# Patient Record
Sex: Female | Born: 1985 | Race: White | Hispanic: No | Marital: Single | Smoking: Former smoker
Health system: Southern US, Community
[De-identification: ages and names within clinical notes are randomized; demographics above are authoritative.]

## PROBLEM LIST (undated history)

## (undated) ENCOUNTER — Inpatient Hospital Stay (HOSPITAL_COMMUNITY): Payer: Self-pay

## (undated) DIAGNOSIS — Z8711 Personal history of peptic ulcer disease: Secondary | ICD-10-CM

## (undated) DIAGNOSIS — J45909 Unspecified asthma, uncomplicated: Secondary | ICD-10-CM

## (undated) DIAGNOSIS — F419 Anxiety disorder, unspecified: Secondary | ICD-10-CM

## (undated) DIAGNOSIS — R51 Headache: Secondary | ICD-10-CM

## (undated) DIAGNOSIS — N201 Calculus of ureter: Secondary | ICD-10-CM

## (undated) DIAGNOSIS — Z87898 Personal history of other specified conditions: Secondary | ICD-10-CM

## (undated) DIAGNOSIS — K219 Gastro-esophageal reflux disease without esophagitis: Secondary | ICD-10-CM

## (undated) DIAGNOSIS — F429 Obsessive-compulsive disorder, unspecified: Secondary | ICD-10-CM

## (undated) DIAGNOSIS — N289 Disorder of kidney and ureter, unspecified: Secondary | ICD-10-CM

## (undated) DIAGNOSIS — A048 Other specified bacterial intestinal infections: Secondary | ICD-10-CM

## (undated) DIAGNOSIS — I499 Cardiac arrhythmia, unspecified: Secondary | ICD-10-CM

## (undated) HISTORY — DX: Other specified bacterial intestinal infections: A04.8

## (undated) HISTORY — PX: CERVIX LESION DESTRUCTION: SHX591

## (undated) HISTORY — DX: Personal history of peptic ulcer disease: Z87.11

## (undated) HISTORY — PX: TONSILLECTOMY AND ADENOIDECTOMY: SUR1326

## (undated) HISTORY — PX: TYMPANOSTOMY TUBE PLACEMENT: SHX32

## (undated) HISTORY — PX: EXTRACORPOREAL SHOCK WAVE LITHOTRIPSY: SHX1557

## (undated) HISTORY — PX: WISDOM TOOTH EXTRACTION: SHX21

---
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
## 2007-03-21 ENCOUNTER — Ambulatory Visit: Payer: Self-pay | Admitting: Oncology

## 2007-05-08 ENCOUNTER — Ambulatory Visit: Payer: Self-pay | Admitting: Oncology

## 2007-07-23 ENCOUNTER — Ambulatory Visit: Payer: Self-pay | Admitting: Oncology

## 2007-07-24 LAB — CBC WITH DIFFERENTIAL/PLATELET
BASO%: 0.3 % (ref 0.0–2.0)
Basophils Absolute: 0 10*3/uL (ref 0.0–0.1)
EOS%: 7 % (ref 0.0–7.0)
Eosinophils Absolute: 0.7 10*3/uL — ABNORMAL HIGH (ref 0.0–0.5)
HCT: 41.8 % (ref 34.8–46.6)
HGB: 14.7 g/dL (ref 11.6–15.9)
LYMPH%: 24.6 % (ref 14.0–48.0)
MCH: 29.9 pg (ref 26.0–34.0)
MCHC: 35.2 g/dL (ref 32.0–36.0)
MCV: 85 fL (ref 81.0–101.0)
MONO#: 0.8 10*3/uL (ref 0.1–0.9)
MONO%: 7.9 % (ref 0.0–13.0)
NEUT#: 5.8 10*3/uL (ref 1.5–6.5)
NEUT%: 60.2 % (ref 39.6–76.8)
Platelets: 288 10*3/uL (ref 145–400)
RBC: 4.92 10*6/uL (ref 3.70–5.32)
RDW: 13.1 % (ref 11.3–14.5)
WBC: 9.6 10*3/uL (ref 3.9–10.0)
lymph#: 2.4 10*3/uL (ref 0.9–3.3)

## 2007-07-24 LAB — APTT: aPTT: 28 seconds (ref 24–37)

## 2007-07-24 LAB — PROTHROMBIN TIME
INR: 1 (ref 0.0–1.5)
Prothrombin Time: 13.5 seconds (ref 11.6–15.2)

## 2007-07-24 LAB — MORPHOLOGY
PLT EST: ADEQUATE
RBC Comments: NORMAL

## 2007-07-26 LAB — FACTOR 5 LEIDEN

## 2007-07-26 LAB — PROTHROMBIN GENE MUTATION

## 2009-02-10 IMAGING — CT CT UROGRAM
2 of 3 series · 15 of 42 positions shown, 19 images · non-contrast
Comparison: NONE

CLINICAL DATA: Left flank pain.  Hx. renal calculi. 

CT UROGRAM
TECHNIQUE: Thin-section unenhanced axial images were obtained to 
provide a CT urogram to evaluate for possible urinary tract stone. 
 Scan thicknesses were 3.0 mm with 3.0-mm increments.

[Series 2: wo · axial · 0.66mm/px · z∈[+490,+847]mm · 12 of 135 slices shown, 16 images]
[im 8/135  soft-tissue]
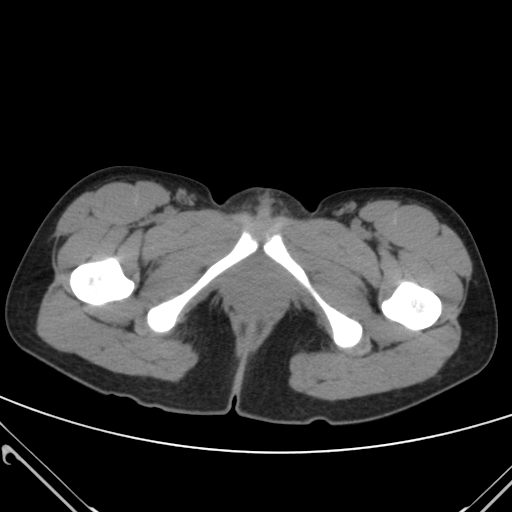
[im 8/135  bone]
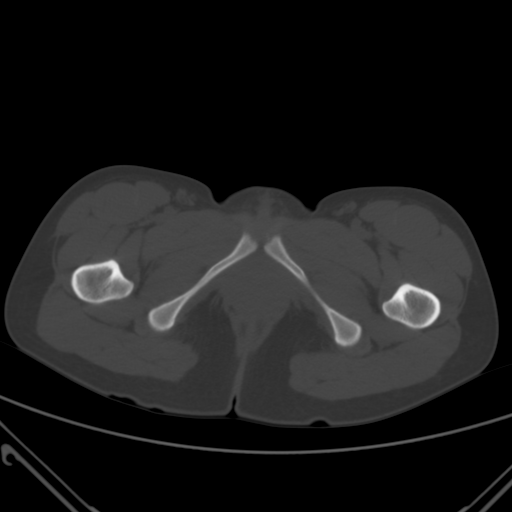
[im 22/135  soft-tissue]
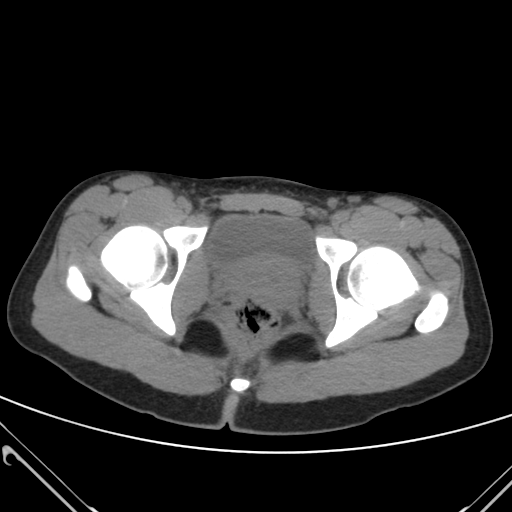
[im 36/135  soft-tissue]
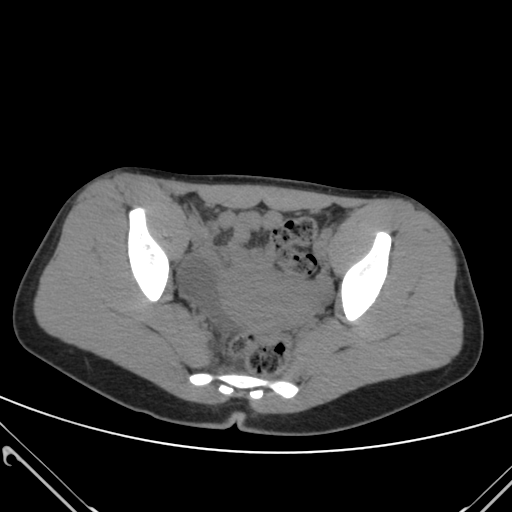
[im 50/135  soft-tissue]
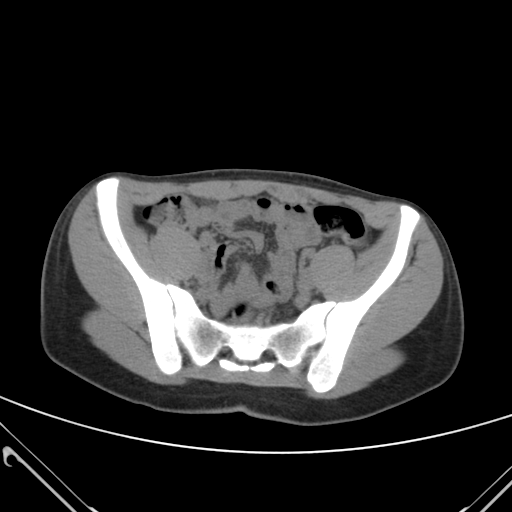
[im 64/135  soft-tissue]
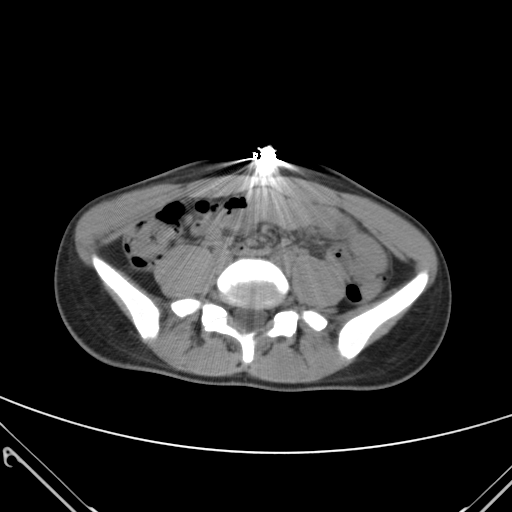
[im 71/135  soft-tissue]
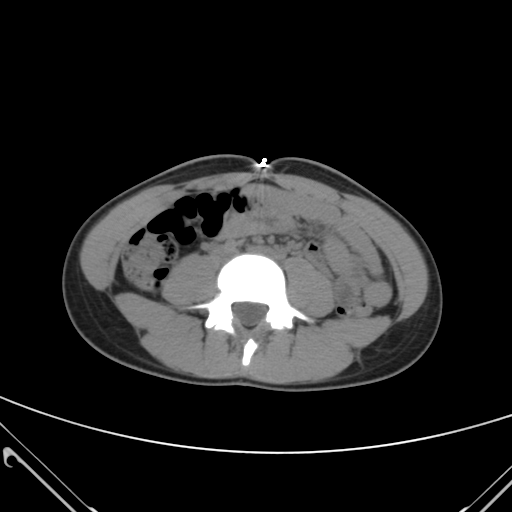
[im 85/135  soft-tissue]
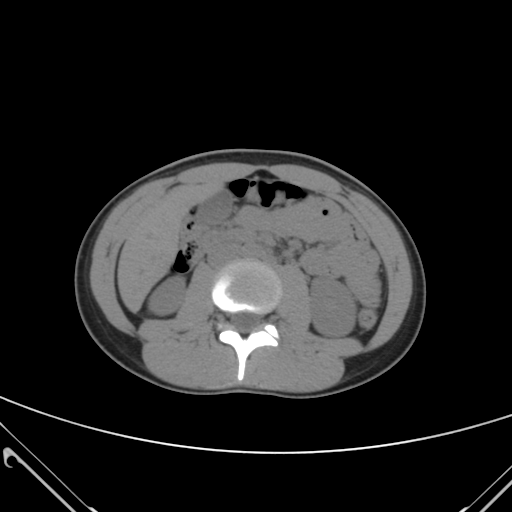
[im 99/135  soft-tissue]
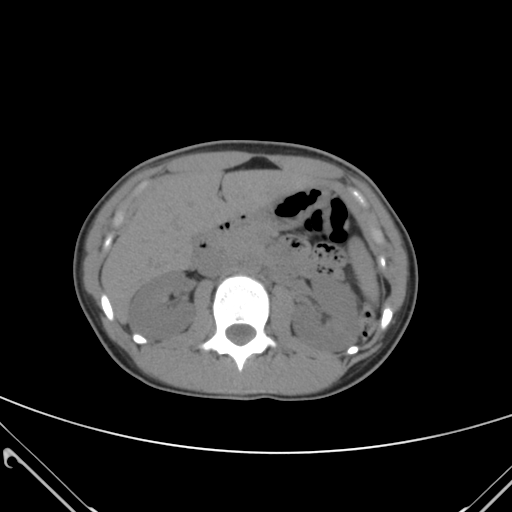
[im 106/135  lung]
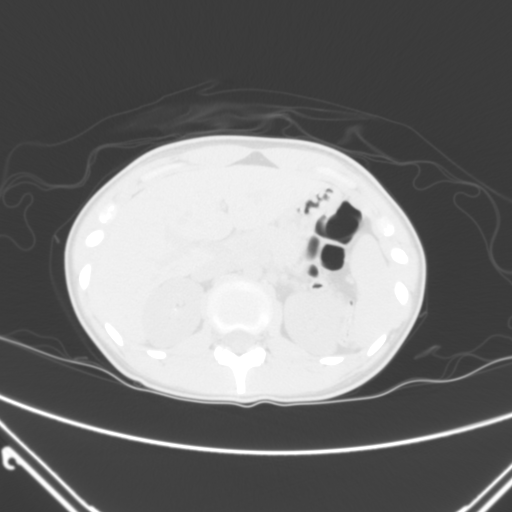
[im 113/135  soft-tissue]
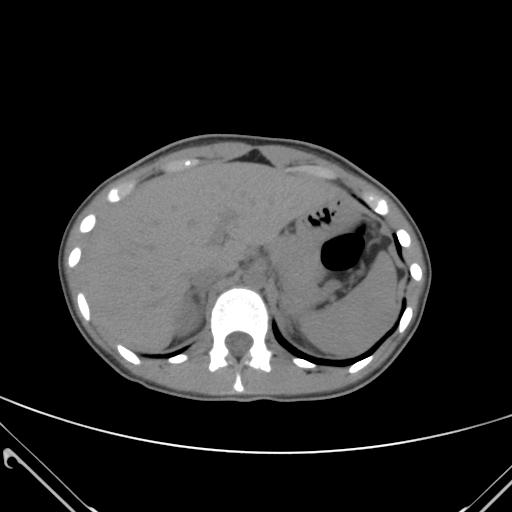
[im 113/135  lung]
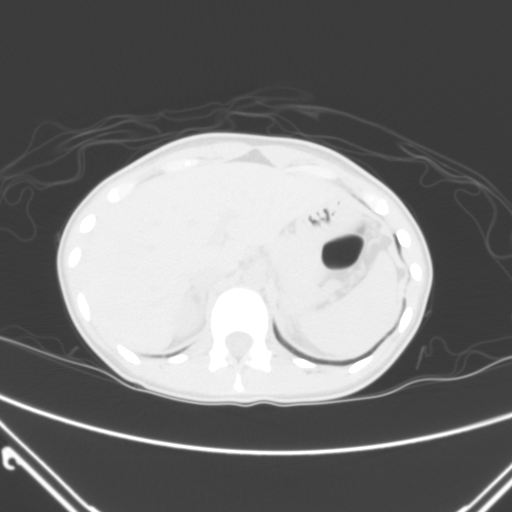
[im 113/135  bone]
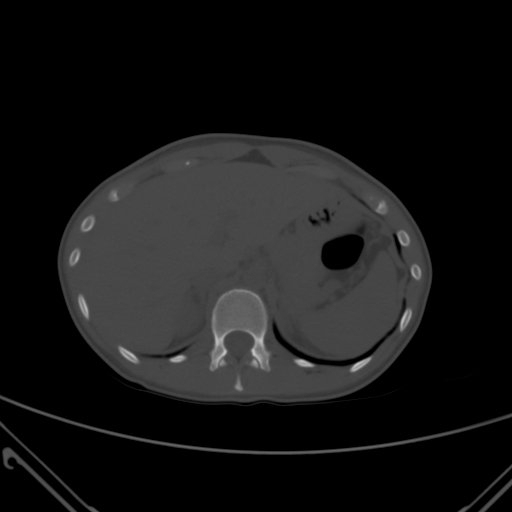
[im 120/135  lung]
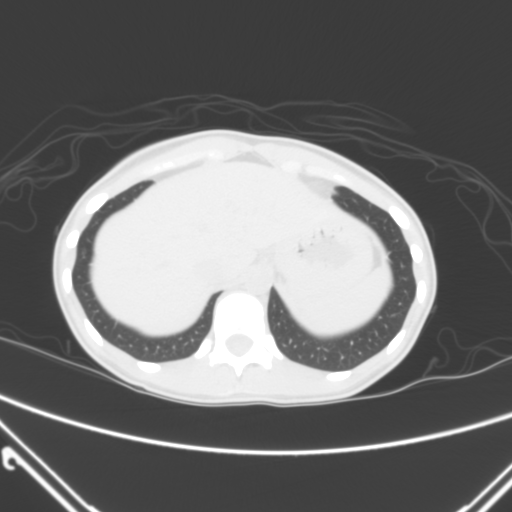
[im 127/135  soft-tissue]
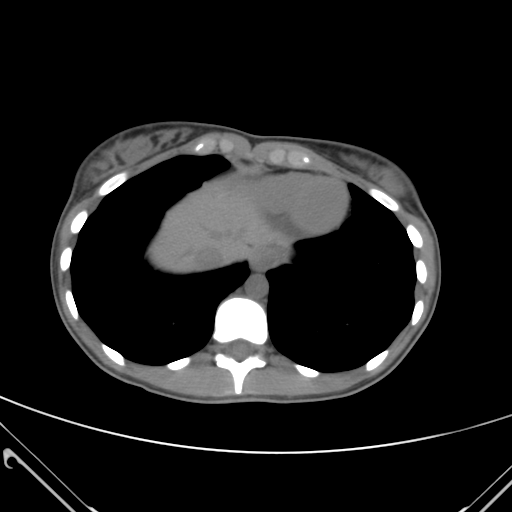
[im 127/135  lung]
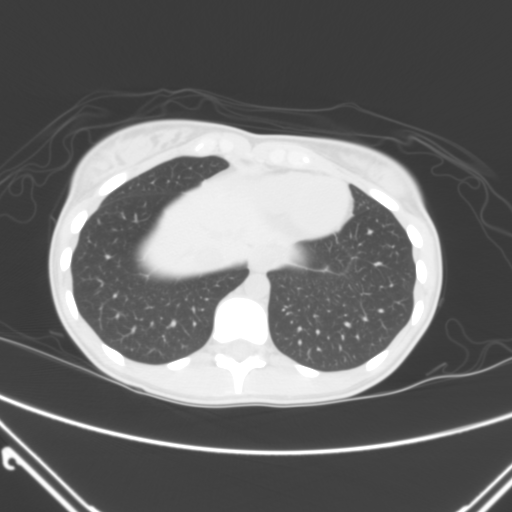

[coronals · coronal · 0.78mm/px · 3 of 51 slices shown]
[im 17/51  soft-tissue]
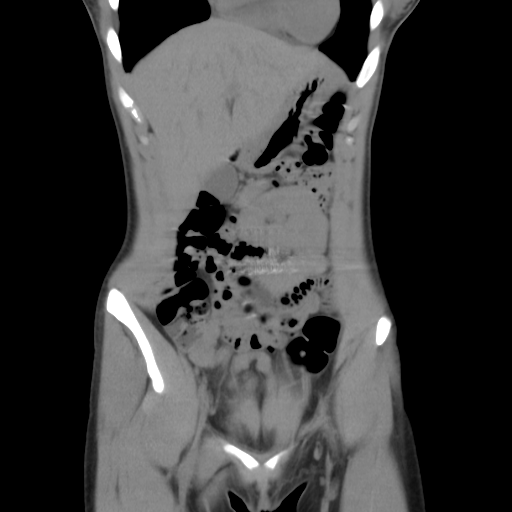
[im 23/51  soft-tissue]
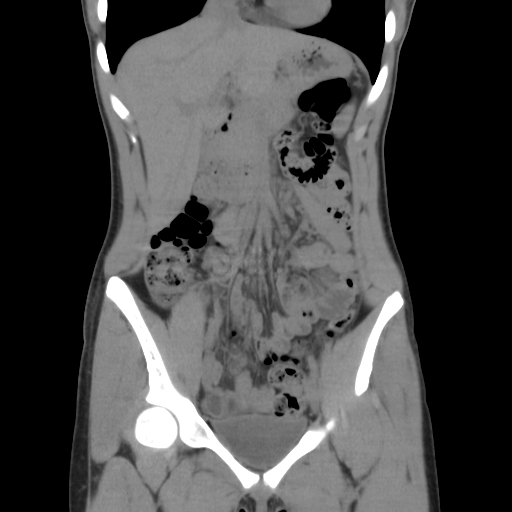
[im 28/51  soft-tissue]
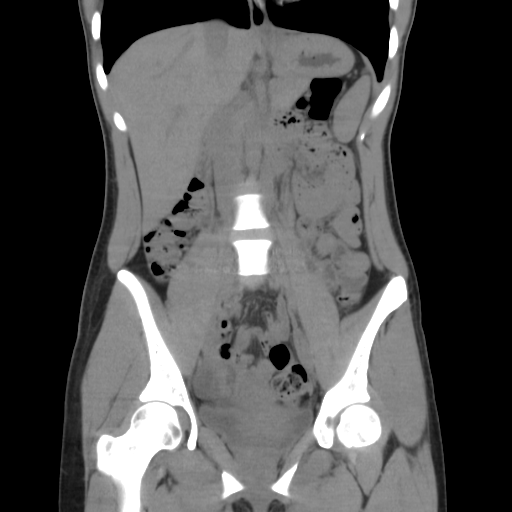

[15 of 42 positions shown; findings below may reference images not displayed]

FINDINGS: Comparison to previous CT Abdomen and Pelvis dated 
07-02-2006. The lung bases are clear.  The liver, spleen, 
pancreas, and adrenal glands have a normal appearance.  There are 
small 2-3 mm calculi within the kidneys bilaterally, which are 
nonobstructing.  No ureteral calculi are identified.  No evidence 
for obstruction is seen.  Incidentally noted is a fluid-filled 
structure in the right adnexal region, likely representing an 
ovarian cyst measuring 8.5x9.9x2.5 cm.  No free fluid is seen.  
The appendix is felt to be normal.
IMPRESSION: Bilateral nonobstructing renal calculi.  No evidence 
for ureterolithiasis is identified.  Right adnexal cystic mass. 
Date: 07/05/2007  Tran Date: 07/05/2007 DAS  JLM

## 2009-06-06 ENCOUNTER — Inpatient Hospital Stay (HOSPITAL_COMMUNITY): Admission: AD | Admit: 2009-06-06 | Discharge: 2009-06-06 | Payer: Self-pay | Admitting: Obstetrics and Gynecology

## 2009-06-09 ENCOUNTER — Emergency Department (HOSPITAL_COMMUNITY): Admission: EM | Admit: 2009-06-09 | Discharge: 2009-06-09 | Payer: Self-pay | Admitting: Dentistry

## 2010-09-02 ENCOUNTER — Encounter: Payer: Self-pay | Admitting: Cardiology

## 2010-09-13 ENCOUNTER — Encounter: Payer: Self-pay | Admitting: Cardiology

## 2010-09-27 ENCOUNTER — Ambulatory Visit: Payer: Self-pay

## 2010-09-27 ENCOUNTER — Ambulatory Visit: Payer: Self-pay | Admitting: Cardiology

## 2010-09-27 ENCOUNTER — Encounter: Payer: Self-pay | Admitting: Cardiology

## 2010-09-27 DIAGNOSIS — R55 Syncope and collapse: Secondary | ICD-10-CM | POA: Insufficient documentation

## 2010-09-27 DIAGNOSIS — N83209 Unspecified ovarian cyst, unspecified side: Secondary | ICD-10-CM | POA: Insufficient documentation

## 2010-09-27 DIAGNOSIS — R002 Palpitations: Secondary | ICD-10-CM | POA: Insufficient documentation

## 2010-09-27 DIAGNOSIS — N200 Calculus of kidney: Secondary | ICD-10-CM | POA: Insufficient documentation

## 2010-09-27 DIAGNOSIS — R42 Dizziness and giddiness: Secondary | ICD-10-CM | POA: Insufficient documentation

## 2010-09-27 DIAGNOSIS — J45909 Unspecified asthma, uncomplicated: Secondary | ICD-10-CM | POA: Insufficient documentation

## 2010-10-11 ENCOUNTER — Encounter: Payer: Self-pay | Admitting: Cardiology

## 2010-10-11 ENCOUNTER — Ambulatory Visit (HOSPITAL_COMMUNITY)
Admission: RE | Admit: 2010-10-11 | Discharge: 2010-10-11 | Payer: Self-pay | Source: Home / Self Care | Admitting: Cardiology

## 2010-10-11 ENCOUNTER — Ambulatory Visit: Payer: Self-pay

## 2010-10-11 ENCOUNTER — Ambulatory Visit: Payer: Self-pay | Admitting: Cardiology

## 2010-10-11 HISTORY — PX: TRANSTHORACIC ECHOCARDIOGRAM: SHX275

## 2010-11-10 ENCOUNTER — Telehealth (INDEPENDENT_AMBULATORY_CARE_PROVIDER_SITE_OTHER): Payer: Self-pay | Admitting: *Deleted

## 2010-11-29 ENCOUNTER — Ambulatory Visit
Admission: RE | Admit: 2010-11-29 | Discharge: 2010-11-29 | Payer: Self-pay | Source: Home / Self Care | Attending: Cardiology | Admitting: Cardiology

## 2010-12-14 NOTE — Assessment & Plan Note (Signed)
Summary: np6/dx:irregular heart rate ;syncope/lg   History of Present Illness: 25 yo female for evaluation of syncope. Over the past 2-3 months the patient describes episodes of near syncope. She will suddenly feel lightheaded followed by her heart racing, and a hot feeling, mild nausea and diaphoresis. These episodes can last 30 minutes to one hour. They resolved spontaneously. She has not had frank syncope. She also notes occasional dyspnea on exertion but no orthopnea, PND, pedal edema or exertional chest pain. Because of the above we were asked to further evaluate.  Current Medications (verified): 1)  Birth Control .... As Directed  Past History:  Past Medical History: OVARIAN CYST  NEPHROLITHIASIS  ASTHMA  PUD  Past Surgical History: lithotripsy tonsillectomy  Wisdom teeth  Family History: Reviewed history from 09/27/2010 and no changes required. Respiratory/asthma Hiatal Hernia/ Peptic ulcer No premature CAD No sudden death  Social History: Reviewed history from 09/27/2010 and no changes required. Tobacco Use - Yes Single  Full Time Alcohol Use - yes  Review of Systems       no fevers or chills, productive cough, hemoptysis, dysphasia, odynophagia, melena, hematochezia, dysuria, hematuria, rash, seizure activity, orthopnea, PND, pedal edema, claudication. Remaining systems are negative.   Vital Signs:  Patient profile:   25 year old female Height:      62 inches Weight:      108 pounds BMI:     19.82 Pulse rate:   68 / minute Resp:     14 per minute BP sitting:   109 / 68  (left arm)  Vitals Entered By: Kem Parkinson (September 27, 2010 10:12 AM)  Physical Exam  General:  Well developed/well nourished in NAD Skin warm/dry Patient not depressed No peripheral clubbing Back-normal HEENT-normal/normal eyelids Neck supple/normal carotid upstroke bilaterally; no bruits; no JVD; no thyromegaly chest - CTA/ normal expansion CV - RRR/normal S1 and S2; no  murmurs, rubs or gallops;  PMI nondisplaced Abdomen -NT/ND, no HSM, no mass, + bowel sounds, no bruit 2+ femoral pulses, no bruits Ext-no edema, chords, 2+ DP Neuro-grossly nonfocal     EKG  Procedure date:  09/27/2010  Findings:      NSR with no ST changes; normal QT interval  Impression & Recommendations:  Problem # 1:  SYNCOPE (ICD-780.2) Patient describes near syncopal episodes. Some of her description sounds possibly vagal in etiology. I have asked her to stay well hydrated. I will schedule a CardioNet monitor and an echocardiogram to more fully evaluate.  Problem # 2:  PALPITATIONS (ICD-785.1) Schedule CardioNet.  Other Orders: Echocardiogram (Echo) Event (Event)  Patient Instructions: 1)  Your physician recommends that you schedule a follow-up appointment in: 8 WEEKS 2)  Your physician has requested that you have an echocardiogram.  Echocardiography is a painless test that uses sound waves to create images of your heart. It provides your doctor with information about the size and shape of your heart and how well your heart's chambers and valves are working.  This procedure takes approximately one hour. There are no restrictions for this procedure. 3)  Your physician has recommended that you wear an event monitor.  Event monitors are medical devices that record the heart's electrical activity. Doctors most often use these monitors to diagnose arrhythmias. Arrhythmias are problems with the speed or rhythm of the heartbeat. The monitor is a small, portable device. You can wear one while you do your normal daily activities. This is usually used to diagnose what is causing palpitations/syncope (passing out).

## 2010-12-14 NOTE — Letter (Signed)
Summary: [facility] Office Note   [facility] Office Note   Imported By: Roderic Ovens 10/21/2010 13:12:17  _____________________________________________________________________  External Attachment:    Type:   Image     Comment:   External Document

## 2010-12-15 NOTE — Progress Notes (Signed)
  Phone Note Outgoing Call   Call placed by: Scherrie Bateman, LPN,  November 10, 2010 3:47 PM Call placed to: Patient'S MOTHER Summary of Call: PT'S MOTHER AWARE PER DR Jens Som  MONITOR SHOWS SINUS./ Initial call taken by: Scherrie Bateman, LPN,  November 10, 2010 3:48 PM

## 2010-12-15 NOTE — Assessment & Plan Note (Signed)
Summary: F2M PER CHECK OUT ON 11/15/LG   CC:  follow up..pt complains of sob..pt states it maybe related to asthma.  History of Present Illness: 25 year old female I saw in November of 2011 for palpitations and syncope. An echocardiogram in November of 2011 showed normal LV function. A CardioNet showed sinus rhythm to sinus tachycardia. Since I last saw her, the patient has dyspnea with more extreme activities but not with routine activities. She attributes to her asthma. It is relieved with rest. It is not associated with chest pain. There is no orthopnea, PND or pedal edema. There is no syncope. There is no exertional chest pain. She has had brief palpitations when she feels "stressed". There's been no further presyncopal episodes.   Current Medications (verified): 1)  Birth Control .... As Directed 2)  Advil 200 Mg Tabs (Ibuprofen) .... As Needed  Past History:  Past Medical History: Reviewed history from 09/27/2010 and no changes required. OVARIAN CYST  NEPHROLITHIASIS  ASTHMA  PUD  Past Surgical History: Reviewed history from 09/27/2010 and no changes required. lithotripsy tonsillectomy  Wisdom teeth  Social History: Reviewed history from 09/27/2010 and no changes required. Tobacco Use - Yes Single  Full Time Alcohol Use - yes  Review of Systems       no fevers or chills, productive cough, hemoptysis, dysphasia, odynophagia, melena, hematochezia, dysuria, hematuria, rash, seizure activity, orthopnea, PND, pedal edema, claudication. Remaining systems are negative.   Vital Signs:  Patient profile:   25 year old female Height:      62 inches Weight:      110 pounds BMI:     20.19 Pulse rate:   70 / minute Resp:     14 per minute BP sitting:   100 / 64  (left arm)  Vitals Entered By: Kem Parkinson (November 29, 2010 8:59 AM)  Physical Exam  General:  Well-developed well-nourished in no acute distress.  Skin is warm and dry.  HEENT is normal.  Neck is  supple. No thyromegaly.  Chest is clear to auscultation with normal expansion.  Cardiovascular exam is regular rate and rhythm.  Abdominal exam nontender or distended. No masses palpated. Extremities show no edema. neuro grossly intact    Impression & Recommendations:  Problem # 1:  PALPITATIONS (ICD-785.1) No pathology demonstrated at this point. If she has increased symptoms in the future we can consider a beta blocker at that point.  Problem # 2:  SYNCOPE (ICD-780.2) No recurrent presyncopal episodes. LV function normal.

## 2011-02-19 LAB — URINALYSIS, ROUTINE W REFLEX MICROSCOPIC
Bilirubin Urine: NEGATIVE
Bilirubin Urine: NEGATIVE
Glucose, UA: NEGATIVE mg/dL
Glucose, UA: NEGATIVE mg/dL
Ketones, ur: NEGATIVE mg/dL
Ketones, ur: >80 mg/dL — AB
Leukocytes, UA: NEGATIVE
Leukocytes, UA: NEGATIVE
Nitrite: NEGATIVE
Nitrite: NEGATIVE
Protein, ur: NEGATIVE mg/dL
Protein, ur: NEGATIVE mg/dL
Specific Gravity, Urine: 1.009 (ref 1.005–1.030)
Specific Gravity, Urine: >1.03 — ABNORMAL HIGH (ref 1.005–1.030)
Urobilinogen, UA: 0.2 mg/dL (ref 0.0–1.0)
Urobilinogen, UA: 0.2 mg/dL (ref 0.0–1.0)
pH: 7.5 (ref 5.0–8.0)
pH: 6 (ref 5.0–8.0)

## 2011-02-19 LAB — POCT PREGNANCY, URINE
Preg Test, Ur: NEGATIVE
Preg Test, Ur: NEGATIVE

## 2011-02-19 LAB — DIFFERENTIAL
Basophils Absolute: 0.1 10*3/uL (ref 0.0–0.1)
Basophils Relative: 1 % (ref 0–1)
Eosinophils Absolute: 0.4 10*3/uL (ref 0.0–0.7)
Eosinophils Relative: 3 % (ref 0–5)
Lymphocytes Relative: 14 % (ref 12–46)
Lymphs Abs: 1.6 10*3/uL (ref 0.7–4.0)
Monocytes Absolute: 1 10*3/uL (ref 0.1–1.0)
Monocytes Relative: 9 % (ref 3–12)
Neutro Abs: 8.8 10*3/uL — ABNORMAL HIGH (ref 1.7–7.7)
Neutrophils Relative %: 74 % (ref 43–77)

## 2011-02-19 LAB — CBC
HCT: 41.4 % (ref 36.0–46.0)
HCT: 39.2 % (ref 36.0–46.0)
Hemoglobin: 14.4 g/dL (ref 12.0–15.0)
Hemoglobin: 13.8 g/dL (ref 12.0–15.0)
MCHC: 34.8 g/dL (ref 30.0–36.0)
MCHC: 35.2 g/dL (ref 30.0–36.0)
MCV: 89.5 fL (ref 78.0–100.0)
MCV: 90.7 fL (ref 78.0–100.0)
Platelets: 242 10*3/uL (ref 150–400)
Platelets: 206 10*3/uL (ref 150–400)
RBC: 4.63 MIL/uL (ref 3.87–5.11)
RBC: 4.31 MIL/uL (ref 3.87–5.11)
RDW: 12.5 % (ref 11.5–15.5)
RDW: 12.5 % (ref 11.5–15.5)
WBC: 12 10*3/uL — ABNORMAL HIGH (ref 4.0–10.5)
WBC: 11.3 10*3/uL — ABNORMAL HIGH (ref 4.0–10.5)

## 2011-02-19 LAB — POCT I-STAT, CHEM 8
BUN: 8 mg/dL (ref 6–23)
Calcium, Ion: 1.13 mmol/L (ref 1.12–1.32)
Chloride: 104 mEq/L (ref 96–112)
Creatinine, Ser: 1 mg/dL (ref 0.4–1.2)
Glucose, Bld: 84 mg/dL (ref 70–99)
HCT: 42 % (ref 36.0–46.0)
Hemoglobin: 14.3 g/dL (ref 12.0–15.0)
Potassium: 4.4 mEq/L (ref 3.5–5.1)
Sodium: 138 mEq/L (ref 135–145)
TCO2: 26 mmol/L (ref 0–100)

## 2011-02-19 LAB — URINE MICROSCOPIC-ADD ON

## 2011-02-19 LAB — URINE CULTURE

## 2011-03-31 NOTE — Op Note (Signed)
NAMEJAINA, [name]              ACCOUNT NO.:  [id number]   MEDICAL RECORD NO.:  [id number]          PATIENT TYPE:  AMB   LOCATION:  DAY                          FACILITY[facility]  [facility]   PHYSICIAN:  Valetta Fuller, M.D.  DATE OF BIRTH:  [birth year]   DATE OF PROCEDURE:  07/05/2006  DATE OF DISCHARGE:                                 OPERATIVE REPORT   PREOPERATIVE DIAGNOSIS:  Left distal ureteral calculus.   POSTOPERATIVE DIAGNOSIS:  Left distal ureteral calculus.   PROCEDURE PERFORMED:  Cystoscopy, left retrograde pyelography, left  ureteroscopy, stone basketing, left double-J stent placement.   SURGEON:  Valetta Fuller, M.D.   ANESTHESIA:  General.   INDICATIONS:  [name] is a 25 year old female.  She presented recently  with sudden onset of severe left abdominal and flank pain.  She initially  apparently had been seen at [facility] emergency room but a definitive  diagnosis had not been established at that time.  They thought she might be  having some ovulatory discomfort.  She subsequently was sent for a CT  through [facility] and the patient was noted have a  3 mm distal left ureteral stone just above her left ureterovesical junction.  I saw her approximately 3 days ago and at that time her clinical situation  had improved.  She did have high-grade obstruction on CT but was recently  comfortable.  We felt that given the distal location and small size of the  stone that the prudent course of action was to try to allow her to pass this  spontaneously.  We were called to yesterday by the family stating that she  was continuing has severe pain and was in agony not able to really take much  in the way of p.o. and they requested definitive intervention.  They  appeared to understand the advantages and disadvantages of surgery, the  potential complications, rationale, etc. and full informed consent was  obtained.   TECHNIQUE AND FINDINGD:  [name] was  brought to the operating room where  she had successful induction of general anesthesia.  She was placed in  lithotomy position.  She did receive IV antibiotic therapy.  On cystoscopy,  she did have evidence of inflammation and mucosal edema around the left  distal ureteral orifice and intramural ureter.   Attention was then turned towards retrograde pyelography.  An 8-French cone-  tip catheter was inserted in the distal left ureteral orifice and on  contrast administration there was a filling defect a centimeter or two above  the ureterovesical junction with moderate dilation.  This was done with  fluoroscopic guidance and my interpretation was the only reading.   Guidewire was then placed up to left renal pelvis without difficulty.  Initial attempts to engage the distal left ureter were difficult and we were  able to get into the orifice but could see considerable mucosal edema and  inflammation and it was difficult to really pass the scope safely.  For that  reason was removed and over the guidewire we used the inside portion of an  access  sheath to provide one-step dilation at approximately 10-12 [place].  That was then removed and the ureteroscope was easily engaged in the distal  ureter.  Several centimeters up a 3-4 mm round hard appearing stone was  encountered.  This was then basket extracted with a nitinol basket without  any difficulty.  Because of need for dilation as well as a considerable  mucosal edema.  We felt stent placement was indicated.  Over  the guidewire we placed a 5-French 24 cm Polaris stent and left the dangle  string attached which was secured to her inner thigh.  The patient received  Toradol IM and lidocaine jelly postoperatively.  She was brought to recovery  room in stable condition.           ______________________________  Valetta Fuller, M.D.  Electronically Signed     DSG/MEDQ  D:  07/05/2006  T:  07/05/2006  Job:  [id number]

## 2011-08-22 ENCOUNTER — Ambulatory Visit (HOSPITAL_COMMUNITY)
Admission: RE | Admit: 2011-08-22 | Discharge: 2011-08-22 | Disposition: A | Discharge status: Home / Self Care | Payer: Self-pay | Source: Ambulatory Visit | Attending: Obstetrics and Gynecology | Admitting: Obstetrics and Gynecology

## 2011-08-22 LAB — ABO/RH: ABO/RH(D): A NEG

## 2011-12-12 ENCOUNTER — Ambulatory Visit: Payer: Self-pay | Admitting: Physical Therapy

## 2012-12-12 ENCOUNTER — Encounter (HOSPITAL_COMMUNITY): Payer: Self-pay | Admitting: *Deleted

## 2012-12-12 ENCOUNTER — Emergency Department (HOSPITAL_COMMUNITY)
Admission: AD | Admit: 2012-12-12 | Discharge: 2012-12-13 | Disposition: A | Discharge status: Home / Self Care | Payer: Self-pay | Source: Ambulatory Visit | Attending: Emergency Medicine | Admitting: Emergency Medicine

## 2012-12-12 ENCOUNTER — Inpatient Hospital Stay (HOSPITAL_COMMUNITY): Payer: Self-pay

## 2012-12-12 DIAGNOSIS — N200 Calculus of kidney: Secondary | ICD-10-CM | POA: Insufficient documentation

## 2012-12-12 DIAGNOSIS — N189 Chronic kidney disease, unspecified: Secondary | ICD-10-CM | POA: Insufficient documentation

## 2012-12-12 DIAGNOSIS — R112 Nausea with vomiting, unspecified: Secondary | ICD-10-CM | POA: Insufficient documentation

## 2012-12-12 DIAGNOSIS — F411 Generalized anxiety disorder: Secondary | ICD-10-CM | POA: Insufficient documentation

## 2012-12-12 DIAGNOSIS — Z8742 Personal history of other diseases of the female genital tract: Secondary | ICD-10-CM | POA: Insufficient documentation

## 2012-12-12 DIAGNOSIS — J45909 Unspecified asthma, uncomplicated: Secondary | ICD-10-CM | POA: Insufficient documentation

## 2012-12-12 DIAGNOSIS — Z87891 Personal history of nicotine dependence: Secondary | ICD-10-CM | POA: Insufficient documentation

## 2012-12-12 DIAGNOSIS — Z3202 Encounter for pregnancy test, result negative: Secondary | ICD-10-CM | POA: Insufficient documentation

## 2012-12-12 DIAGNOSIS — Z8679 Personal history of other diseases of the circulatory system: Secondary | ICD-10-CM | POA: Insufficient documentation

## 2012-12-12 HISTORY — DX: Anxiety disorder, unspecified: F41.9

## 2012-12-12 HISTORY — DX: Headache: R51

## 2012-12-12 HISTORY — DX: Unspecified asthma, uncomplicated: J45.909

## 2012-12-12 LAB — COMPREHENSIVE METABOLIC PANEL
BUN: 13 mg/dL (ref 6–23)
Calcium: 9.8 mg/dL (ref 8.4–10.5)
Creatinine, Ser: 0.85 mg/dL (ref 0.50–1.10)
GFR calc Af Amer: >90 mL/min (ref >90)
GFR calc non Af Amer: >90 mL/min (ref >90)
Glucose, Bld: 96 mg/dL (ref 70–99)
Sodium: 142 mEq/L (ref 135–145)
Total Protein: 7.1 g/dL (ref 6.0–8.3)

## 2012-12-12 LAB — CBC
HCT: 39.4 % (ref 36.0–46.0)
Hemoglobin: 13.6 g/dL (ref 12.0–15.0)
MCH: 30 pg (ref 26.0–34.0)
MCHC: 34.5 g/dL (ref 30.0–36.0)
MCV: 86.8 fL (ref 78.0–100.0)

## 2012-12-12 LAB — URINALYSIS, ROUTINE W REFLEX MICROSCOPIC
Bilirubin Urine: NEGATIVE
Nitrite: NEGATIVE
Protein, ur: NEGATIVE mg/dL
Specific Gravity, Urine: >1.03 — ABNORMAL HIGH (ref 1.005–1.030)
Urobilinogen, UA: 0.2 mg/dL (ref 0.0–1.0)

## 2012-12-12 LAB — URINE MICROSCOPIC-ADD ON

## 2012-12-12 MED ORDER — HYDROMORPHONE HCL PF 1 MG/ML IJ SOLN
1.0000 mg | INTRAMUSCULAR | Status: DC
  Filled 2012-12-12: qty 1

## 2012-12-12 MED ORDER — IOHEXOL 300 MG/ML  SOLN
100.0000 mL | Freq: Once | INTRAMUSCULAR | Status: AC | PRN
  Administered 2012-12-12: 80 mL via INTRAVENOUS

## 2012-12-12 MED ORDER — PROMETHAZINE HCL 25 MG/ML IJ SOLN
25.0000 mg | Freq: Four times a day (QID) | INTRAMUSCULAR | Status: DC | PRN
  Administered 2012-12-12: 25 mg via INTRAVENOUS
  Filled 2012-12-12 (×2): qty 1

## 2012-12-12 MED ORDER — ONDANSETRON 8 MG PO TBDP
8.0000 mg | ORAL_TABLET | ORAL | Status: AC
  Administered 2012-12-12: 8 mg via ORAL
  Filled 2012-12-12: qty 1

## 2012-12-12 MED ORDER — IOHEXOL 300 MG/ML  SOLN
50.0000 mL | Freq: Once | INTRAMUSCULAR | Status: AC | PRN
  Administered 2012-12-12: 50 mL via ORAL

## 2012-12-12 MED ORDER — HYDROMORPHONE HCL PF 1 MG/ML IJ SOLN
1.0000 mg | Freq: Once | INTRAMUSCULAR | Status: AC
  Administered 2012-12-12: 1 mg via INTRAMUSCULAR

## 2012-12-12 MED ORDER — LACTATED RINGERS IV SOLN
INTRAVENOUS | Status: DC
  Administered 2012-12-12: via INTRAVENOUS

## 2012-12-12 NOTE — MAU Note (Signed)
Pt returned from CT °

## 2012-12-12 NOTE — MAU Note (Signed)
Pt c/o right lower abd pain that goes into vagina and around to low right flank area.  Took 800mg  ibuprofen @ 1500 without relief.  Also c/o nausea for the past two months and says Julio Sicks put her on zofran which she is out of now.

## 2012-12-12 NOTE — MAU Provider Note (Signed)
Chief Complaint: No chief complaint on file.   First Provider Initiated Contact with Patient 12/12/12 1855     SUBJECTIVE HPI: [name] is a 27 y.o. G0P0 who presents to maternity admissions reporting RLQ sharp pain radiating to her groin, urinary frequency and urgency, and hx of kidney stones.  She reports daily nausea x2 months with no vomiting.  Pt has recently been on Depo Provera so has had irregular menses but decided not to get her shot due 12/02/12.  She denies current vaginal bleeding, vaginal itching/burning, h/a, dizziness, or fever/chills.    While in MAU, pt reported nausea, vomited x1, and was diaphoretic.  Family members report that she has these symptoms nightly.  Pt reports hx of anxiety but difficulty taking Xanax prescribed to her.     Past Medical History  Diagnosis Date  . Abnormal Pap smear   . Anxiety   . Headache   . Chronic kidney disease   . Asthma    Past Surgical History  Procedure Date  . Cervix lesion destruction   . Kidney stone surgery   . Tonsillectomy   . Adenoidectomy   . Tubes in ears   . Wisdom tooth extraction    History   Social History  . Marital Status: Single    Spouse Name: N/A    Number of Children: N/A  . Years of Education: N/A   Occupational History  . Not on file.   Social History Main Topics  . Smoking status: Former Smoker    Quit date: 11/11/2012  . Smokeless tobacco: Not on file  . Alcohol Use: Yes  . Drug Use: No  . Sexually Active: Not Currently    Birth Control/ Protection: Injection     Comment: was supposed to get depo on Jan 20th and did not   Other Topics Concern  . Not on file   Social History Narrative  . No narrative on file   No current facility-administered medications on file prior to encounter.   No current outpatient prescriptions on file prior to encounter.   No Known Allergies  ROS: Pertinent items in HPI  OBJECTIVE There were no vitals taken for this visit. GENERAL:  Well-developed, well-nourished female in no acute distress.  HEENT: Normocephalic HEART: normal rate RESP: normal effort ABDOMEN: Soft, non-tender EXTREMITIES: Nontender, no edema NEURO: Alert and oriented Pelvic exam: Cervix pink, visually closed, without lesion, scant white creamy discharge, vaginal walls and external genitalia normal Bimanual exam: Cervix 0/long/high, firm, anterior, neg CMT, uterus mildly nontender, nonenlarged, adnexa with mild tenderness on right, none on left, no enlargement or mass bilaterally  LAB RESULTS Results for orders placed during the hospital encounter of 12/12/12 (from the past 24 hour(s))  URINALYSIS, ROUTINE W REFLEX MICROSCOPIC     Status: Abnormal   Collection Time   12/12/12  5:30 PM      Component Value Range   Color, Urine YELLOW  YELLOW   APPearance CLEAR  CLEAR   Specific Gravity, Urine >1.030 (*) 1.005 - 1.030   pH 6.0  5.0 - 8.0   Glucose, UA NEGATIVE  NEGATIVE mg/dL   Hgb urine dipstick MODERATE (*) NEGATIVE   Bilirubin Urine NEGATIVE  NEGATIVE   Ketones, ur 15 (*) NEGATIVE mg/dL   Protein, ur NEGATIVE  NEGATIVE mg/dL   Urobilinogen, UA 0.2  0.0 - 1.0 mg/dL   Nitrite NEGATIVE  NEGATIVE   Leukocytes, UA NEGATIVE  NEGATIVE  URINE MICROSCOPIC-ADD ON     Status: Abnormal  Collection Time   12/12/12  5:30 PM      Component Value Range   Squamous Epithelial / LPF FEW (*) RARE   RBC / HPF 0-2  <3 RBC/hpf   Urine-Other MUCOUS PRESENT    CBC     Status: Normal   Collection Time   12/12/12  7:00 PM      Component Value Range   WBC 9.5  4.0 - 10.5 K/uL   RBC 4.54  3.87 - 5.11 MIL/uL   Hemoglobin 13.6  12.0 - 15.0 g/dL   HCT 16.1  09.6 - 04.5 %   MCV 86.8  78.0 - 100.0 fL   MCH 30.0  26.0 - 34.0 pg   MCHC 34.5  30.0 - 36.0 g/dL   RDW 40.9  81.1 - 91.4 %   Platelets 280  150 - 400 K/uL   Pregnancy test negative  In MAU: CBC, U/A, Poct pregnancy test, CT scan of abdomen to evaluate kidneys, adnexa, appendix Result     *RADIOLOGY  REPORT*  Clinical Data: Severe right lower quadrant pain  CT ABDOMEN AND PELVIS WITH CONTRAST  Technique: Multidetector CT imaging of the abdomen and pelvis was  performed following the standard protocol during bolus  administration of intravenous contrast.  Contrast: 50mL OMNIPAQUE IOHEXOL 300 MG/ML SOLN  Comparison: 06/09/2009  Findings: Limited images through the lung bases demonstrate no  significant appreciable abnormality. The heart size is within  normal limits. No pleural or pericardial effusion.  Unremarkable liver, spleen, pancreas, biliary system, adrenal  glands.  Nonobstructing left renal stones.  Right renal edema and mild perinephric fat stranding. Mild  hydroureteronephrosis to the level of a 3 mm right UVJ stone.  Decompressed colon without CT evidence for colitis. Normal  appendix. No bowel obstruction.  Normal caliber aorta and branch vessels. Retroaortic left renal  vein.  Decompressed bladder.  Unremarkable CT appearance to the uterus and adnexa. Physiologic  appearing cyst on the left.  No acute osseous finding. Right femoral head bone islands.  IMPRESSION:  Mild right hydroureteronephrosis to the level of a 3 mm right UVJ  stone.  Nonobstructing left renal stones.  Original   Discussed with Dr. Marice Potter- will transfer to [facility] ED Discussed with Dr. Juleen China at [facility] ED who will accept the pt Discussed with pt and family recommended plan of care- pt reluctant to stay in hospital and wants to go home; mother is disabled and cannot care for pt.  Discussed with pt need for comfort control and due to pt's nausea and vomiting along with pain, would recommend pt transfer to  [facility] ED CMET ordered with pending results Pamelia Hoit, RNC/WHNP Report to Pamelia Hoit, NP  Sharen Counter Certified Nurse-Midwife 12/12/2012  7:02 PM

## 2012-12-13 ENCOUNTER — Encounter (HOSPITAL_COMMUNITY): Payer: Self-pay | Admitting: *Deleted

## 2012-12-13 MED ORDER — HYDROMORPHONE HCL PF 1 MG/ML IJ SOLN
1.0000 mg | Freq: Once | INTRAMUSCULAR | Status: DC
  Filled 2012-12-13: qty 1

## 2012-12-13 MED ORDER — KETOROLAC TROMETHAMINE 30 MG/ML IJ SOLN
30.0000 mg | Freq: Once | INTRAMUSCULAR | Status: DC
  Filled 2012-12-13: qty 1

## 2012-12-13 MED ORDER — PROMETHAZINE HCL 25 MG RE SUPP: 25.0000 mg | Freq: Four times a day (QID) | RECTAL | Status: DC | PRN

## 2012-12-13 MED ORDER — PROMETHAZINE HCL 25 MG/ML IJ SOLN
12.5000 mg | Freq: Once | INTRAMUSCULAR | Status: AC
  Administered 2012-12-13: 12.5 mg via INTRAVENOUS
  Filled 2012-12-13: qty 1

## 2012-12-13 MED ORDER — PROMETHAZINE HCL 25 MG PO TABS: 25.0000 mg | ORAL_TABLET | Freq: Four times a day (QID) | ORAL | Status: DC | PRN

## 2012-12-13 MED ORDER — LORAZEPAM 2 MG/ML IJ SOLN
1.0000 mg | Freq: Once | INTRAMUSCULAR | Status: AC
  Administered 2012-12-13: 1 mg via INTRAVENOUS
  Filled 2012-12-13: qty 1

## 2012-12-13 NOTE — ED Notes (Signed)
Pt is able to tolerate a cracker and soda

## 2012-12-13 NOTE — ED Notes (Signed)
[id number]<AV> Expected date:<BR> Expected time:<BR> Means of arrival:<BR> Comments:<BR> [facility]-pain not controlled

## 2012-12-13 NOTE — ED Notes (Signed)
Went to bedside to medicate patient for pain and nausea. Patient declines to receive Toradol at this time. States her current pain level is a "5" and does not want pain medication at this time.

## 2012-12-13 NOTE — ED Notes (Signed)
[facility] when she was seen for sharp pain to RLQ that radiated to groin. She also reported nausea x2 months and had episode of emesis and diaphoresis while there. Transferred [facility] for care and treatment of kidney stone Campos-Garcia, Tresa Endo

## 2012-12-13 NOTE — ED Notes (Signed)
Pt unable to hold down saltines and a drink.

## 2012-12-13 NOTE — ED Provider Notes (Signed)
History     CSN: [id number]  Arrival date & time 12/12/12  1726   First MD Initiated Contact with Patient 12/13/12 0024      Chief Complaint  Patient presents with  . Nephrolithiasis    (Consider location/radiation/quality/duration/timing/severity/associated sxs/prior treatment) HPI 27 year old female presents to [facility] long emergency department as a transfer from [facility] hospital with complaint of right flank pain. Patient found to have 3 mm stone at the right UVJ. Patient was transferred to 2 persistent pain. Patient reports her pain actually is much improved, just a dull ache now. She reports after receiving Dilaudid, she became very nauseated and has had persistent vomiting. No prior history of kidney stone. She reports symptoms started earlier today. She has long-standing history of nausea. She denies any fevers.  Past Medical History  Diagnosis Date  . Abnormal Pap smear   . Anxiety   . Headache   . Chronic kidney disease   . Asthma     Past Surgical History  Procedure Date  . Cervix lesion destruction   . Kidney stone surgery   . Tonsillectomy   . Adenoidectomy   . Tubes in ears   . Wisdom tooth extraction     Family History  Problem Relation Age of Onset  . Diabetes Mother   . Arthritis Mother   . Hypertension Mother   . Heart disease Mother   . Hyperlipidemia Mother   . Cancer Father   . Arthritis Father   . Cancer Maternal Aunt   . Leukemia Maternal Aunt   . Hyperlipidemia Maternal Aunt   . Cancer Maternal Uncle   . Hyperlipidemia Maternal Uncle   . Cancer Paternal Aunt   . Arthritis Maternal Grandmother   . Hyperlipidemia Maternal Grandmother   . Heart disease Maternal Grandfather   . Hyperlipidemia Maternal Grandfather   . Cancer Paternal Grandfather     History  Substance Use Topics  . Smoking status: Former Smoker    Quit date: 11/11/2012  . Smokeless tobacco: Not on file  . Alcohol Use: Yes    OB History    Grav Para Term Preterm  Abortions TAB SAB Ect Mult Living   0               Review of Systems  See History of Present Illness; otherwise all other systems are reviewed and negative  Allergies  Review of patient's allergies indicates no known allergies.  Home Medications   Current Outpatient Rx  Name  Route  Sig  Dispense  Refill  . DIAZEPAM 5 MG PO TABS   Oral   Take 5 mg by mouth every 6 (six) hours as needed. anxiety         . IBUPROFEN 200 MG PO TABS   Oral   Take 200 mg by mouth every 6 (six) hours as needed. headache         . ONDANSETRON HCL 4 MG PO TABS   Oral   Take 4 mg by mouth every 8 (eight) hours as needed. nausea           BP 142/86  Pulse 92  Temp 98 F (36.7 C) (Oral)  Resp 15  SpO2 98%  LMP 11/11/2012  Physical Exam  Nursing note and vitals reviewed. Constitutional: She is oriented to person, place, and time. She appears well-developed and well-nourished. She appears distressed (tearful, upset).  HENT:  Head: Normocephalic and atraumatic.  Right Ear: External ear normal.  Left Ear: External ear normal.  Nose: Nose normal.  Mouth/Throat: Oropharynx is clear and moist.  Eyes: Conjunctivae normal and EOM are normal. Pupils are equal, round, and reactive to light.  Neck: Normal range of motion. Neck supple. No JVD present. No tracheal deviation present. No thyromegaly present.  Cardiovascular: Normal rate, regular rhythm, normal heart sounds and intact distal pulses.  Exam reveals no gallop and no friction rub.   No murmur heard. Pulmonary/Chest: Effort normal and breath sounds normal. No stridor. No respiratory distress. She has no wheezes. She has no rales. She exhibits no tenderness.  Abdominal: Soft. Bowel sounds are normal. She exhibits no distension and no mass. There is no tenderness. There is no rebound and no guarding.  Musculoskeletal: Normal range of motion. She exhibits no edema and no tenderness.  Lymphadenopathy:    She has no cervical adenopathy.   Neurological: She is alert and oriented to person, place, and time. She has normal reflexes. No cranial nerve deficit. She exhibits normal muscle tone. Coordination normal.  Skin: Skin is warm and dry. No rash noted. No erythema. No pallor.  Psychiatric: She has a normal mood and affect. Her behavior is normal. Judgment and thought content normal.    ED Course  Procedures (including critical care time)  Labs Reviewed  URINALYSIS, ROUTINE W REFLEX MICROSCOPIC - Abnormal; Notable for the following:    Specific Gravity, Urine >1.030 (*)     Hgb urine dipstick MODERATE (*)     Ketones, ur 15 (*)     All other components within normal limits  URINE MICROSCOPIC-ADD ON - Abnormal; Notable for the following:    Squamous Epithelial / LPF FEW (*)     All other components within normal limits  CBC  COMPREHENSIVE METABOLIC PANEL   Ct Abdomen Pelvis W Contrast  12/12/2012  *RADIOLOGY REPORT*  Clinical Data: Severe right lower quadrant pain  CT ABDOMEN AND PELVIS WITH CONTRAST  Technique:  Multidetector CT imaging of the abdomen and pelvis was performed following the standard protocol during bolus administration of intravenous contrast.  Contrast: 50mL OMNIPAQUE IOHEXOL 300 MG/ML  SOLN  Comparison: 06/09/2009  Findings: Limited images through the lung bases demonstrate no significant appreciable abnormality. The heart size is within normal limits. No pleural or pericardial effusion.  Unremarkable liver, spleen, pancreas, biliary system, adrenal glands.  Nonobstructing left renal stones.  Right renal edema and mild perinephric fat stranding.  Mild hydroureteronephrosis to the level of a 3 mm right UVJ stone.  Decompressed colon without CT evidence for colitis. Normal appendix.  No bowel obstruction.  Normal caliber aorta and branch vessels.  Retroaortic left renal vein.  Decompressed bladder.  Unremarkable CT appearance to the uterus and adnexa.  Physiologic appearing cyst on the left.  No acute osseous  finding. Right femoral head bone islands.  IMPRESSION: Mild right hydroureteronephrosis to the level of a 3 mm right UVJ stone.  Nonobstructing left renal stones.   Original Report Authenticated By: Jearld Lesch, M.D.      1. Kidney stones   2. Nausea and vomiting       MDM  27 year old female with right UVJ stone. We'll attempt to get nausea under control and discharge home follow up with urology as needed.      2:54 AM Patient able to tolerate crackers. Will discharge home to followup with urology as needed  Olivia Mackie, MD 12/13/12 [phone]

## 2013-04-01 ENCOUNTER — Other Ambulatory Visit: Payer: Self-pay | Admitting: Urology

## 2013-05-01 ENCOUNTER — Encounter (HOSPITAL_BASED_OUTPATIENT_CLINIC_OR_DEPARTMENT_OTHER): Payer: Self-pay | Admitting: *Deleted

## 2013-05-05 ENCOUNTER — Encounter (HOSPITAL_BASED_OUTPATIENT_CLINIC_OR_DEPARTMENT_OTHER): Payer: Self-pay | Admitting: *Deleted

## 2013-05-05 NOTE — Progress Notes (Signed)
Pt instructed npo p mn 6/24 x pain or nausea med if needed.  To [facility] 6/25 @ 1000.  Needs hgb, urine hcg on arrival.

## 2013-05-07 ENCOUNTER — Encounter (HOSPITAL_BASED_OUTPATIENT_CLINIC_OR_DEPARTMENT_OTHER)
Admission: RE | Disposition: A | Discharge status: Home / Self Care | Payer: Self-pay | Source: Ambulatory Visit | Attending: Urology

## 2013-05-07 ENCOUNTER — Ambulatory Visit (HOSPITAL_COMMUNITY): Payer: BC Managed Care – PPO

## 2013-05-07 ENCOUNTER — Ambulatory Visit (HOSPITAL_BASED_OUTPATIENT_CLINIC_OR_DEPARTMENT_OTHER)
Admission: RE | Admit: 2013-05-07 | Discharge: 2013-05-07 | Disposition: A | Discharge status: Home / Self Care | Payer: BC Managed Care – PPO | Source: Ambulatory Visit | Attending: Urology | Admitting: Urology

## 2013-05-07 ENCOUNTER — Encounter (HOSPITAL_BASED_OUTPATIENT_CLINIC_OR_DEPARTMENT_OTHER): Payer: Self-pay

## 2013-05-07 ENCOUNTER — Ambulatory Visit (HOSPITAL_BASED_OUTPATIENT_CLINIC_OR_DEPARTMENT_OTHER): Payer: BC Managed Care – PPO | Admitting: Anesthesiology

## 2013-05-07 ENCOUNTER — Encounter (HOSPITAL_BASED_OUTPATIENT_CLINIC_OR_DEPARTMENT_OTHER): Payer: Self-pay | Admitting: Anesthesiology

## 2013-05-07 DIAGNOSIS — F17200 Nicotine dependence, unspecified, uncomplicated: Secondary | ICD-10-CM | POA: Insufficient documentation

## 2013-05-07 DIAGNOSIS — I498 Other specified cardiac arrhythmias: Secondary | ICD-10-CM | POA: Insufficient documentation

## 2013-05-07 DIAGNOSIS — K219 Gastro-esophageal reflux disease without esophagitis: Secondary | ICD-10-CM | POA: Insufficient documentation

## 2013-05-07 DIAGNOSIS — Z8711 Personal history of peptic ulcer disease: Secondary | ICD-10-CM | POA: Insufficient documentation

## 2013-05-07 DIAGNOSIS — N201 Calculus of ureter: Secondary | ICD-10-CM | POA: Insufficient documentation

## 2013-05-07 DIAGNOSIS — F410 Panic disorder [episodic paroxysmal anxiety] without agoraphobia: Secondary | ICD-10-CM | POA: Insufficient documentation

## 2013-05-07 HISTORY — DX: Cardiac arrhythmia, unspecified: I49.9

## 2013-05-07 HISTORY — DX: Personal history of other specified conditions: Z87.898

## 2013-05-07 HISTORY — PX: CYSTOSCOPY WITH RETROGRADE PYELOGRAM, URETEROSCOPY AND STENT PLACEMENT: SHX5789

## 2013-05-07 HISTORY — DX: Gastro-esophageal reflux disease without esophagitis: K21.9

## 2013-05-07 HISTORY — DX: Calculus of ureter: N20.1

## 2013-05-07 LAB — POCT I-STAT, CHEM 8
BUN: 5 mg/dL — ABNORMAL LOW (ref 6–23)
Chloride: 109 mEq/L (ref 96–112)
Creatinine, Ser: 0.6 mg/dL (ref 0.50–1.10)
Potassium: 4 mEq/L (ref 3.5–5.1)
Sodium: 140 mEq/L (ref 135–145)

## 2013-05-07 LAB — POCT PREGNANCY, URINE: Preg Test, Ur: NEGATIVE

## 2013-05-07 SURGERY — CYSTOURETEROSCOPY, WITH RETROGRADE PYELOGRAM AND STENT INSERTION
Anesthesia: General | Site: Ureter | Laterality: Bilateral | Wound class: Clean

## 2013-05-07 MED ORDER — OXYCODONE-ACETAMINOPHEN 5-325 MG PO TABS: 1.0000 | ORAL_TABLET | ORAL | Status: DC | PRN

## 2013-05-07 MED ORDER — IOHEXOL 350 MG/ML SOLN
INTRAVENOUS | Status: DC | PRN
  Administered 2013-05-07: 25 mL

## 2013-05-07 MED ORDER — METOCLOPRAMIDE HCL 5 MG/ML IJ SOLN
INTRAMUSCULAR | Status: DC | PRN
  Administered 2013-05-07: 10 mg via INTRAVENOUS

## 2013-05-07 MED ORDER — KETOROLAC TROMETHAMINE 30 MG/ML IJ SOLN
15.0000 mg | Freq: Once | INTRAMUSCULAR | Status: DC | PRN
  Filled 2013-05-07: qty 1

## 2013-05-07 MED ORDER — OXYBUTYNIN CHLORIDE 5 MG PO TABS: 5.0000 mg | ORAL_TABLET | Freq: Three times a day (TID) | ORAL | Status: DC | PRN

## 2013-05-07 MED ORDER — ONDANSETRON HCL 4 MG/2ML IJ SOLN
INTRAMUSCULAR | Status: DC | PRN
  Administered 2013-05-07: 4 mg via INTRAVENOUS

## 2013-05-07 MED ORDER — FENTANYL CITRATE 0.05 MG/ML IJ SOLN
INTRAMUSCULAR | Status: DC | PRN
  Administered 2013-05-07: 50 ug via INTRAVENOUS
  Administered 2013-05-07 (×6): 25 ug via INTRAVENOUS

## 2013-05-07 MED ORDER — LACTATED RINGERS IV SOLN
INTRAVENOUS | Status: DC
  Administered 2013-05-07 (×2): via INTRAVENOUS
  Filled 2013-05-07: qty 1000

## 2013-05-07 MED ORDER — GENTAMICIN IN SALINE 1.6-0.9 MG/ML-% IV SOLN
80.0000 mg | INTRAVENOUS | Status: DC
  Filled 2013-05-07: qty 50

## 2013-05-07 MED ORDER — FENTANYL CITRATE 0.05 MG/ML IJ SOLN
25.0000 ug | INTRAMUSCULAR | Status: DC | PRN
  Filled 2013-05-07: qty 1

## 2013-05-07 MED ORDER — OXYBUTYNIN CHLORIDE 5 MG PO TABS
5.0000 mg | ORAL_TABLET | Freq: Three times a day (TID) | ORAL | Status: DC | PRN
  Administered 2013-05-07: 5 mg via ORAL
  Filled 2013-05-07: qty 1

## 2013-05-07 MED ORDER — DEXAMETHASONE SODIUM PHOSPHATE 4 MG/ML IJ SOLN
INTRAMUSCULAR | Status: DC | PRN
  Administered 2013-05-07: 10 mg via INTRAVENOUS

## 2013-05-07 MED ORDER — SODIUM CHLORIDE 0.9 % IR SOLN
Status: DC | PRN
  Administered 2013-05-07: 6000 mL via INTRAVESICAL

## 2013-05-07 MED ORDER — SENNA-DOCUSATE SODIUM 8.6-50 MG PO TABS: 1.0000 | ORAL_TABLET | Freq: Two times a day (BID) | ORAL | Status: DC

## 2013-05-07 MED ORDER — LIDOCAINE HCL (CARDIAC) 20 MG/ML IV SOLN
INTRAVENOUS | Status: DC | PRN
  Administered 2013-05-07: 60 mg via INTRAVENOUS

## 2013-05-07 MED ORDER — PROPOFOL INFUSION 10 MG/ML OPTIME
INTRAVENOUS | Status: DC | PRN
  Administered 2013-05-07: 100 ug/kg/min via INTRAVENOUS

## 2013-05-07 MED ORDER — KETOROLAC TROMETHAMINE 30 MG/ML IJ SOLN
INTRAMUSCULAR | Status: DC | PRN
  Administered 2013-05-07: 30 mg via INTRAVENOUS

## 2013-05-07 MED ORDER — PROMETHAZINE HCL 25 MG/ML IJ SOLN
INTRAMUSCULAR | Status: DC | PRN
  Administered 2013-05-07: 6.25 mg via INTRAVENOUS

## 2013-05-07 MED ORDER — GENTAMICIN SULFATE 40 MG/ML IJ SOLN
250.0000 mg | INTRAVENOUS | Status: AC
  Administered 2013-05-07: 250 mg via INTRAVENOUS
  Filled 2013-05-07: qty 6.25

## 2013-05-07 MED ORDER — PROMETHAZINE HCL 25 MG/ML IJ SOLN
6.2500 mg | INTRAMUSCULAR | Status: DC | PRN
  Filled 2013-05-07: qty 1

## 2013-05-07 MED ORDER — MIDAZOLAM HCL 5 MG/5ML IJ SOLN
INTRAMUSCULAR | Status: DC | PRN
  Administered 2013-05-07: 2 mg via INTRAVENOUS

## 2013-05-07 MED ORDER — PROPOFOL 10 MG/ML IV BOLUS
INTRAVENOUS | Status: DC | PRN
  Administered 2013-05-07: 20 mg via INTRAVENOUS
  Administered 2013-05-07: 200 mg via INTRAVENOUS

## 2013-05-07 SURGICAL SUPPLY — 39 items
ADAPTER CATH URET PLST 4-6FR (CATHETERS) IMPLANT
BAG DRAIN URO-CYSTO SKYTR STRL (DRAIN) ×3 IMPLANT
BAG URO CATCHER STRL LF (DRAPE) ×3 IMPLANT
BASKET LASER NITINOL 1.9FR (BASKET) ×3 IMPLANT
BASKET STNLS GEMINI 4WIRE 3FR (BASKET) IMPLANT
BASKET ZERO TIP NITINOL 2.4FR (BASKET) IMPLANT
BRUSH URET BIOPSY 3F (UROLOGICAL SUPPLIES) IMPLANT
CANISTER SUCT LVC 12 LTR MEDI- (MISCELLANEOUS) ×3 IMPLANT
CATH FOLEY 2WAY  3CC  8FR (CATHETERS)
CATH FOLEY 2WAY 3CC 8FR (CATHETERS) IMPLANT
CATH INTERMIT  6FR 70CM (CATHETERS) IMPLANT
CATH URET 5FR 28IN CONE TIP (BALLOONS)
CATH URET 5FR 28IN OPEN ENDED (CATHETERS) ×3 IMPLANT
CATH URET 5FR 70CM CONE TIP (BALLOONS) IMPLANT
CLOTH BEACON ORANGE TIMEOUT ST (SAFETY) ×3 IMPLANT
DRAPE CAMERA CLOSED 9X96 (DRAPES) ×3 IMPLANT
ELECT REM PT RETURN 9FT ADLT (ELECTROSURGICAL)
ELECTRODE REM PT RTRN 9FT ADLT (ELECTROSURGICAL) IMPLANT
GLOVE BIO SURGEON STRL SZ 6.5 (GLOVE) ×3 IMPLANT
GLOVE BIO SURGEON STRL SZ7.5 (GLOVE) ×3 IMPLANT
GLOVE INDICATOR 7.0 STRL GRN (GLOVE) ×3 IMPLANT
GOWN PREVENTION PLUS LG XLONG (DISPOSABLE) ×3 IMPLANT
GOWN STRL REIN XL XLG (GOWN DISPOSABLE) ×3 IMPLANT
GUIDEWIRE 0.038 PTFE COATED (WIRE) IMPLANT
GUIDEWIRE ANG ZIPWIRE 038X150 (WIRE) ×3 IMPLANT
GUIDEWIRE STR DUAL SENSOR (WIRE) ×3 IMPLANT
IV NS IRRIG 3000ML ARTHROMATIC (IV SOLUTION) ×6 IMPLANT
KIT BALLIN UROMAX 15FX10 (LABEL) IMPLANT
KIT BALLN UROMAX 15FX4 (MISCELLANEOUS) IMPLANT
KIT BALLN UROMAX 26 75X4 (MISCELLANEOUS)
PACK CYSTOSCOPY (CUSTOM PROCEDURE TRAY) ×3 IMPLANT
SET HIGH PRES BAL DIL (LABEL)
SHEATH ACCESS URETERAL 24CM (SHEATH) ×3 IMPLANT
SHEATH URET ACCESS 12FR/35CM (UROLOGICAL SUPPLIES) IMPLANT
SHEATH URET ACCESS 12FR/55CM (UROLOGICAL SUPPLIES) IMPLANT
STENT URET 6FRX24 CONTOUR (STENTS) ×6 IMPLANT
SYRINGE 10CC LL (SYRINGE) ×3 IMPLANT
SYRINGE IRR TOOMEY STRL 70CC (SYRINGE) IMPLANT
TUBE FEEDING 8FR 16IN STR KANG (MISCELLANEOUS) ×3 IMPLANT

## 2013-05-07 NOTE — H&P (Signed)
[name] is an 27 y.o. female.    Chief Complaint: Pre-Op Bilateral Ureteroscopic Stone Manipulation  HPI:   1 - Surgical Nephrolithiasis -  2007 - Rt URS by Isabel Caprice 11/2012 - ER visit for 3mm Rt UVJ stone. CT at time with two left 3mm intrarenal stones.  PMH sig for arrythmia (neg Halter eval prior). NO CV disese. No blood thinners.  Today [name] is seen to proceed with elective bilateral ureteroscopic stone manipulation. She continues to complain of persitatne Rt>Lt colicky adominal pain and has not passed stone since 11/2012 known on CT. She is also interested if future fertility in near future.  No interval fevers or stonepassage.   Past Medical History  Diagnosis Date  . Headache(784.0)   . Asthma   . Ureteral calculi     BILATERAL  . History of syncope     2011--  W/ PALPITATIONS  --  ECHO NORMAL  . Anxiety     panic attacks  . Dysrhythmia     pt states she's scheduled to waer halter monitor in July d/t "skipped beats"  . GERD (gastroesophageal reflux disease)     hx of " stomach ulcers"    Past Surgical History  Procedure Laterality Date  . Cervix lesion destruction    . Wisdom tooth extraction    . Tympanostomy tube placement    . Tonsillectomy and adenoidectomy    . Left ureterscopic stone extraction  07-05-2006  . Extracorporeal shock wave lithotripsy    . Transthoracic echocardiogram  10-11-2010    NORMAL LVF/ EF 55-65%/ NORMAL VALVE'S    Family History  Problem Relation Age of Onset  . Diabetes Mother   . Arthritis Mother   . Hypertension Mother   . Heart disease Mother   . Hyperlipidemia Mother   . Cancer Father   . Arthritis Father   . Cancer Maternal Aunt   . Leukemia Maternal Aunt   . Hyperlipidemia Maternal Aunt   . Cancer Maternal Uncle   . Hyperlipidemia Maternal Uncle   . Cancer Paternal Aunt   . Arthritis Maternal Grandmother   . Hyperlipidemia Maternal Grandmother   . Heart disease Maternal Grandfather   . Hyperlipidemia  Maternal Grandfather   . Cancer Paternal Grandfather    Social History:  reports that she has been smoking Cigarettes.  She has a 3 pack-year smoking history. She does not have any smokeless tobacco history on file. She reports that  drinks alcohol. She reports that she does not use illicit drugs.  Allergies: No Known Allergies  No prescriptions prior to admission    No results found for this or any previous visit (from the past 48 hour(s)). No results found.  Review of Systems  Constitutional: Negative.  Negative for fever and chills.  HENT: Negative.   Eyes: Negative.   Respiratory: Negative.   Cardiovascular: Negative.   Gastrointestinal: Negative.   Genitourinary: Positive for flank pain. Negative for hematuria.  Musculoskeletal: Negative.   Skin: Negative.   Neurological: Negative.   Endo/Heme/Allergies: Negative.   Psychiatric/Behavioral: Negative.     Height 5\' 2"  (1.575 m), weight 48.988 kg (108 lb), last menstrual period 04/21/2013. Physical Exam  Constitutional: She is oriented to person, place, and time. She appears well-developed and well-nourished.  HENT:  Head: Normocephalic and atraumatic.  Eyes: EOM are normal. Pupils are equal, round, and reactive to light.  Neck: Normal range of motion. Neck supple.  Cardiovascular: Normal rate and regular rhythm.   Respiratory: Effort normal and  breath sounds normal.  GI: Soft. Bowel sounds are normal.  Genitourinary:  Minimal Rt CVAT  Musculoskeletal: Normal range of motion.  Neurological: She is alert and oriented to person, place, and time.  Skin: Skin is warm and dry.  Psychiatric: She has a normal mood and affect. Her behavior is normal. Judgment and thought content normal.     Assessment/Plan  1 - Surgical Nephrolithiasis -   We discussed ureteroscopic stone manipulation with basketing and laser-lithotripsy in detail.  We discussed risks including bleeding, infection, damage to kidney / ureter  bladder,  rarely loss of kidney. We discussed anesthetic risks and rare but serious surgical complications including DVT, PE, MI, and mortality. We specifically addressed that in 5-10% of cases a staged approach is required with stenting followed by re-attempt ureteroscopy if anatomy unfavorable.   The patient voiced understanding and wises to proceed for the goal of confirming rt sided ureteral stone gone / treated and get stone free before elective pregnancy.  Explained will need bilat stents since bilateral procedure.    Jacinto Keil 05/07/2013, 6:09 AM

## 2013-05-07 NOTE — Anesthesia Postprocedure Evaluation (Signed)
  Anesthesia Post-op Note  Patient: [name]  Procedure(s) Performed: Procedure(s) (LRB): CYSTOSCOPY WITH RETROGRADE PYELOGRAM, URETEROSCOPY,  BASKET STONE EXTRACTIONS AND STENT PLACEMENT, BILATERAL (Bilateral)  Patient Location: PACU  Anesthesia Type: General  Level of Consciousness: awake and alert   Airway and Oxygen Therapy: Patient Spontanous Breathing  Post-op Pain: mild  Post-op Assessment: Post-op Vital signs reviewed, Patient's Cardiovascular Status Stable, Respiratory Function Stable, Patent Airway and No signs of Nausea or vomiting  Last Vitals:  Filed Vitals:   05/07/13 1400  BP: 118/76  Pulse: 90  Temp:   Resp: 21    Post-op Vital Signs: stable   Complications: No apparent anesthesia complications

## 2013-05-07 NOTE — Anesthesia Preprocedure Evaluation (Signed)
Anesthesia Evaluation  Patient identified by MRN, date of birth, ID band Patient awake    Reviewed: Allergy & Precautions, H&P , NPO status , Patient's Chart, lab work & pertinent test results  Airway Mallampati: II TM Distance: >3 FB Neck ROM: Full    Dental no notable dental hx.    Pulmonary neg pulmonary ROS, Current Smoker,  breath sounds clear to auscultation  Pulmonary exam normal       Cardiovascular + dysrhythmias Rhythm:Regular Rate:Normal  pt states she's scheduled to waer halter monitor in July d/t "skipped beats   Neuro/Psych negative neurological ROS  negative psych ROS   GI/Hepatic Neg liver ROS, GERD-  Medicated,  Endo/Other  negative endocrine ROS  Renal/GU negative Renal ROS  negative genitourinary   Musculoskeletal negative musculoskeletal ROS (+)   Abdominal   Peds negative pediatric ROS (+)  Hematology negative hematology ROS (+)   Anesthesia Other Findings   Reproductive/Obstetrics negative OB ROS                           Anesthesia Physical Anesthesia Plan  ASA: II  Anesthesia Plan: General   Post-op Pain Management:    Induction: Intravenous  Airway Management Planned: LMA  Additional Equipment:   Intra-op Plan:   Post-operative Plan:   Informed Consent: I have reviewed the patients History and Physical, chart, labs and discussed the procedure including the risks, benefits and alternatives for the proposed anesthesia with the patient or authorized representative who has indicated his/her understanding and acceptance.   Dental advisory given  Plan Discussed with: CRNA and Surgeon  Anesthesia Plan Comments:         Anesthesia Quick Evaluation

## 2013-05-07 NOTE — Anesthesia Procedure Notes (Signed)
Procedure Name: LMA Insertion Date/Time: 05/07/2013 12:38 PM Performed by: Norva Pavlov Pre-anesthesia Checklist: Patient identified, Emergency Drugs available, Suction available and Patient being monitored Patient Re-evaluated:Patient Re-evaluated prior to inductionOxygen Delivery Method: Circle System Utilized Preoxygenation: Pre-oxygenation with 100% oxygen Intubation Type: IV induction Ventilation: Mask ventilation without difficulty LMA: LMA inserted LMA Size: 3.0 Number of attempts: 1 Airway Equipment and Method: bite block Placement Confirmation: positive ETCO2 Tube secured with: Tape Dental Injury: Teeth and Oropharynx as per pre-operative assessment

## 2013-05-07 NOTE — Transfer of Care (Signed)
Immediate Anesthesia Transfer of Care Note  Patient: [name]  Procedure(s) Performed: Procedure(s) (LRB): CYSTOSCOPY WITH RETROGRADE PYELOGRAM, URETEROSCOPY AND STENT PLACEMENT, Bilateral (Bilateral)  Patient Location: PACU  Anesthesia Type: General  Level of Consciousness: awake, alert  and oriented  Airway & Oxygen Therapy: Patient Spontanous Breathing and Patient connected to face mask oxygen  Post-op Assessment: Report given to PACU RN and Post -op Vital signs reviewed and stable  Post vital signs: Reviewed and stable  Complications: No apparent anesthesia complications

## 2013-05-07 NOTE — Brief Op Note (Signed)
05/07/2013  1:37 PM  PATIENT:  Melina Modena  27 y.o. female  PRE-OPERATIVE DIAGNOSIS:  BILATERAL URETERAL STONES  POST-OPERATIVE DIAGNOSIS:  BILATERAL URETERAL STONES  PROCEDURE:  Procedure(s): CYSTOSCOPY WITH RETROGRADE PYELOGRAM, URETEROSCOPY AND STENT PLACEMENT, Bilateral (Bilateral)  SURGEON:  Surgeon(s) and Role:    * Sebastian Ache, MD - Primary  PHYSICIAN ASSISTANT:   ASSISTANTS: none   ANESTHESIA:   general  EBL:  Total I/O In: 1000 [I.V.:1000] Out: -   BLOOD ADMINISTERED:none  DRAINS: none   LOCAL MEDICATIONS USED:  NONE  SPECIMEN:  Source of Specimen:  Bilateral Renal Stones  DISPOSITION OF SPECIMEN:  [facility] for compositional analysis  COUNTS:  YES  TOURNIQUET:  * No tourniquets in log *  DICTATION: .Other Dictation: Dictation Number [id number]  PLAN OF CARE: Discharge to home after PACU  PATIENT DISPOSITION:  PACU - hemodynamically stable.   Delay start of Pharmacological VTE agent (>24hrs) due to surgical blood loss or risk of bleeding: not applicable

## 2013-05-07 NOTE — Discharge Instructions (Signed)
1 - You may have urinary urgency (bladder spasms) and bloody urine on / off with stent in place. This is normal.  2 - Call MD or go to ER for fever >102, severe pain / nausea / vomiting not relieved by medications, or acute change in medical status  No advil, aleve, motrin, ibuprofen until 7:30 pm  Post Anesthesia Home Care Instructions  Activity: Get plenty of rest for the remainder of the day. A responsible adult should stay with you for 24 hours following the procedure.  For the next 24 hours, DO NOT: -Drive a car -Advertising copywriter -Drink alcoholic beverages -Take any medication unless instructed by your physician -Make any legal decisions or sign important papers.  Meals: Start with liquid foods such as gelatin or soup. Progress to regular foods as tolerated. Avoid greasy, spicy, heavy foods. If nausea and/or vomiting occur, drink only clear liquids until the nausea and/or vomiting subsides. Call your physician if vomiting continues.  Special Instructions/Symptoms: Your throat may feel dry or sore from the anesthesia or the breathing tube placed in your throat during surgery. If this causes discomfort, gargle with warm salt water. The discomfort should disappear within 24 hours. [facility] [phone] Post Ureteroscopy With or Without Stent Instructions  Definitions:  Ureter: The duct that transports urine from the kidney to the bladder. Stent:   A plastic hollow tube that is placed into the ureter, from the kidney to the                 bladder to prevent the ureter from swelling shut.  GENERAL INSTRUCTIONS:  Despite the fact that no skin incisions were used, the area around the ureter and bladder is raw and irritated. The stent is a foreign body which will further irritate the bladder wall. This irritation is manifested by increased frequency of urination, both day and night, and by an increase in the urge to urinate. In some, the urge to urinate is present  almost always. Sometimes the urge is strong enough that you may not be able to stop yourself from urinating. The only real cure is to remove the stent and then give time for the bladder wall to heal which can't be done until the danger of the ureter swelling shut has passed, which varies.  You may see some blood in your urine while the stent is in place and a few days afterwards. Do not be alarmed, even if the urine was clear for a while. Get off your feet and drink lots of fluids until clearing occurs. If you start to pass clots or don't improve, call us.  DIET: You may return to your normal diet immediately. Because of the raw surface of your bladder, alcohol, spicy foods, acid type foods and drinks with caffeine may cause irritation or frequency and should be used in moderation. To keep your urine flowing freely and to avoid constipation, drink plenty of fluids during the day ( 8-10 glasses ). Tip: Avoid cranberry juice because it is very acidic.  ACTIVITY: Your physical activity doesn't need to be restricted. However, if you are very active, you may see some blood in your urine. We suggest that you reduce your activity under these circumstances until the bleeding has stopped.  BOWELS: It is important to keep your bowels regular during the postoperative period. Straining with bowel movements can cause bleeding. A bowel movement every other day is reasonable. Use a mild laxative if needed, such as Milk of Magnesia 2-3 tablespoons,  or 2 Dulcolax tablets. Call if you continue to have problems. If you have been taking narcotics for pain, before, during or after your surgery, you may be constipated. Take a laxative if necessary.   MEDICATION: You should resume your pre-surgery medications unless told not to. In addition you will often be given an antibiotic to prevent infection. These should be taken as prescribed until the bottles are finished unless you are having an unusual reaction to one of the  drugs.  PROBLEMS YOU SHOULD REPORT TO [place]:  Fevers over 100.5 Fahrenheit.  Heavy bleeding, or clots ( See above notes about blood in urine ).  Inability to urinate.  Drug reactions ( hives, rash, nausea, vomiting, diarrhea ).  Severe burning or pain with urination that is not improving.  FOLLOW-UP: You will need a follow-up appointment to monitor your progress. Call for this appointment at the number listed above. Usually the first appointment will be about three to fourteen days after your surgery.

## 2013-05-08 ENCOUNTER — Encounter (HOSPITAL_BASED_OUTPATIENT_CLINIC_OR_DEPARTMENT_OTHER): Payer: Self-pay | Admitting: Urology

## 2013-05-08 NOTE — Op Note (Signed)
NAMEKATHELINE, BRENDLINGER              ACCOUNT NO.:  [id number]  MEDICAL RECORD NO.:  [id number]  LOCATION:                               FACILITY:  [facility]  PHYSICIAN:  Sebastian Ache, MD     DATE OF BIRTH:  24-Aug-1986  DATE OF PROCEDURE:  05/07/2013 DATE OF DISCHARGE:  05/07/2013                              OPERATIVE REPORT   DIAGNOSIS:  Bilateral renal stones, flank pain.  PROCEDURE: 1. Cystoscopy with bilateral retrograde pyelograms interpretation. 2. Bilateral ureteral stent placement, 6 x 24, no tether. 3. Bilateral ureteroscopy with basketing of stones.  FINDINGS: 1. No ureteral stones. 2. Bilateral small intrarenal stones approximately 6 on the right, 3     on the left. 3. All stones larger than 1/3 mm were basketed and brought out in     their entirety, stone smaller than 1/3 mm were dislodged for     passage.  ESTIMATED BLOOD LOSS:  Nil.  COMPLICATIONS:  None.  SPECIMEN:  Bilateral renal stones, analysis.  INDICATION:  [name] is a pleasant 27 year old lady with history of recurrent nephrolithiasis.  She most recently had a right ureterovesical junction stone causing right flank pain, but she is unsure of interval passage.  She also has known bilateral intrarenal stones.  She is young and a fertile age and considering childbearing soon.  We discussed management options including observation with medical therapy versus shockwave lithotripsy versus surgery to ureteroscopy with the goals of being getting her stone free, especially prior to elective pregnancy and she wished to proceed.  Informed consent was obtained, placed in medical record.  PROCEDURE IN DETAIL:  The patient being [name], was verified. Procedure being cysto and bilateral ureteroscopic stone manipulation was confirmed.  Procedure was carried out.  Time-out was performed. Intravenous antibiotics were administered.  General LMA anesthesia was introduced.  The patient was placed into a low  lithotomy position and sterile field was created by prepping and draping the patient's vagina, introitus, and proximal thighs using iodine x3.  Next, cystourethroscopy was performed using a 22-French rigid scope with 12-degree offset lens. Inspection of the anterior and posterior urethra were unremarkable. Inspection of urinary bladder revealed no diverticula, calcifications, papillary lesions.  Ureteral orifices were in their normal anatomic position.  The right ureteral orifice was gently cannulated using a 6- French end-hole catheter and right retrograde pyelogram was obtained.  Right retrograde pyelogram demonstrated a single right ureter, single system right kidney.  There was a questionable filling defect in the distal ureter consistent with a small stone versus air bubble.  No hydroureteronephrosis.  A 0.038 Glidewire was advanced at the level of the upper pole and set aside as a safety wire.  Next, semi-rigid ureteroscopy was performed in the entire length of the right ureter along a separate Sensor working wire and a feeding tube in the urinary bladder for pressure release.  This revealed no ureteral calcifications and no mucosal abnormalities.  Again, as the goal was getting the patient stone free, we elected to perform flexible ureteroscopy of the kidney.  As such, the semi-rigid ureteroscope was exchanged for a 12/14 28-cm ureteral access sheath, it was carefully placed under fluoroscopic vision.  This allowed flexible ureteroscopy in the proximal ureter and pan endoscopy of the right kidney with 8-French digital ureteroscope. Systematic inspection of each calix revealed approximately 6 small stones, 2 of these were about approximately 2 mm.  They were grasped and brought down their entirety.  The remaining stones were less than 1/3 mm and were dislodged and placed to allow for passage.  Repeat inspection revealed no evidence of perforation and excellent hemostasis.   The access sheath was removed under continuous ureteroscopy and no mucosal abnormalities were found.  Finally, a new 6 x 24 double-J stent was placed on the right side over the remaining safety wire.  Good proximal and distal curl were noted.  Attention was then directed to the left side.  Left retrograde pyelogram was obtained.  This revealed single left ureter, single system left kidney.  No filling defects or narrowing.  A 0.038 Glidewire was advanced to the level of the upper pole and set aside as a safety wire.  Next, semi-rigid ureteroscopy was performed.  The entire left ureter alongside a separate Sensor working wire.  No mucosal abnormalities or calcifications were found on the left ureter.  Similarly, the semi-rigid ureteroscope was exchanged for the 12/14 access sheath, which was placed under fluoroscopy to the level of the proximal ureter.  An 8-French digital ureteroscope was then used to visualize the proximal ureter and entire left kidney.  Three dominant calcifications were found, largest of which was approximately 3 mm and each of these grasped and brought out in their entirety and set aside for compositional analysis.  Final endoscopy revealed no evidence of perforation and excellent hemostasis on the left side.  The access sheath was removed under continuous ureteroscopic vision and no mucosal abnormalities were found.  Finally, a new 6 x 24 double-J stent was placed with remaining safety wire on the left.  Using fluoroscopic guidance, good proximal and distal curl were noted.  The bladder was emptied per cystoscope.  Procedure was then terminated.  The patient tolerated the procedure well, and there were no immediate periprocedural complications.  The patient was taken to postanesthesia care unit in stable condition.          ______________________________ Sebastian Ache, MD     TM/MEDQ  D:  05/07/2013  T:  05/08/2013  Job:  [id number]

## 2013-05-08 NOTE — Op Note (Deleted)
NAME:  [name], [name]              ACCOUNT NO.:  [id number]  MEDICAL RECORD NO.:  [id number]  LOCATION:                               FACILITY:  [facility]  PHYSICIAN:  Norvell Caswell, MD     DATE OF BIRTH:  11/12/1986  DATE OF PROCEDURE:  05/07/2013 DATE OF DISCHARGE:  05/07/2013                              OPERATIVE REPORT   DIAGNOSIS:  Bilateral renal stones, flank pain.  PROCEDURE: 1. Cystoscopy with bilateral retrograde pyelograms interpretation. 2. Bilateral ureteral stent placement, 6 x 24, no tether. 3. Bilateral ureteroscopy with basketing of stones.  FINDINGS: 1. No ureteral stones. 2. Bilateral small intrarenal stones approximately 6 on the right, 3     on the left. 3. All stones larger than 1/3 mm were basketed and brought out in     their entirety, stone smaller than 1/3 mm were dislodged for     passage.  ESTIMATED BLOOD LOSS:  Nil.  COMPLICATIONS:  None.  SPECIMEN:  Bilateral renal stones, analysis.  INDICATION:  [name] is a pleasant 26-year-old lady with history of recurrent nephrolithiasis.  She most recently had a right ureterovesical junction stone causing right flank pain, but she is unsure of interval passage.  She also has known bilateral intrarenal stones.  She is young and a fertile age and considering childbearing soon.  We discussed management options including observation with medical therapy versus shockwave lithotripsy versus surgery to ureteroscopy with the goals of being getting her stone free, especially prior to elective pregnancy and she wished to proceed.  Informed consent was obtained, placed in medical record.  PROCEDURE IN DETAIL:  The patient being [name], was verified. Procedure being cysto and bilateral ureteroscopic stone manipulation was confirmed.  Procedure was carried out.  Time-out was performed. Intravenous antibiotics were administered.  General LMA anesthesia was introduced.  The patient was placed into a low  lithotomy position and sterile field was created by prepping and draping the patient's vagina, introitus, and proximal thighs using iodine x3.  Next, cystourethroscopy was performed using a 22-French rigid scope with 12-degree offset lens. Inspection of the anterior and posterior urethra were unremarkable. Inspection of urinary bladder revealed no diverticula, calcifications, papillary lesions.  Ureteral orifices were in their normal anatomic position.  The right ureteral orifice was gently cannulated using a 6- French end-hole catheter and right retrograde pyelogram was obtained.  Right retrograde pyelogram demonstrated a single right ureter, single system right kidney.  There was a questionable filling defect in the distal ureter consistent with a small stone versus air bubble.  No hydroureteronephrosis.  A 0.038 Glidewire was advanced at the level of the upper pole and set aside as a safety wire.  Next, semi-rigid ureteroscopy was performed in the entire length of the right ureter along a separate Sensor working wire and a feeding tube in the urinary bladder for pressure release.  This revealed no ureteral calcifications and no mucosal abnormalities.  Again, as the goal was getting the patient stone free, we elected to perform flexible ureteroscopy of the kidney.  As such, the semi-rigid ureteroscope was exchanged for a 12/14 28-cm ureteral access sheath, it was carefully placed under fluoroscopic vision.    This allowed flexible ureteroscopy in the proximal ureter and pan endoscopy of the right kidney with 8-French digital ureteroscope. Systematic inspection of each calix revealed approximately 6 small stones, 2 of these were about approximately 2 mm.  They were grasped and brought down their entirety.  The remaining stones were less than 1/3 mm and were dislodged and placed to allow for passage.  Repeat inspection revealed no evidence of perforation and excellent hemostasis.   The access sheath was removed under continuous ureteroscopy and no mucosal abnormalities were found.  Finally, a new 6 x 24 double-J stent was placed on the right side over the remaining safety wire.  Good proximal and distal curl were noted.  Attention was then directed to the left side.  Left retrograde pyelogram was obtained.  This revealed single left ureter, single system left kidney.  No filling defects or narrowing.  A 0.038 Glidewire was advanced to the level of the upper pole and set aside as a safety wire.  Next, semi-rigid ureteroscopy was performed.  The entire left ureter alongside a separate Sensor working wire.  No mucosal abnormalities or calcifications were found on the left ureter.  Similarly, the semi-rigid ureteroscope was exchanged for the 12/14 access sheath, which was placed under fluoroscopy to the level of the proximal ureter.  An 8-French digital ureteroscope was then used to visualize the proximal ureter and entire left kidney.  Three dominant calcifications were found, largest of which was approximately 3 mm and each of these grasped and brought out in their entirety and set aside for compositional analysis.  Final endoscopy revealed no evidence of perforation and excellent hemostasis on the left side.  The access sheath was removed under continuous ureteroscopic vision and no mucosal abnormalities were found.  Finally, a new 6 x 24 double-J stent was placed with remaining safety wire on the left.  Using fluoroscopic guidance, good proximal and distal curl were noted.  The bladder was emptied per cystoscope.  Procedure was then terminated.  The patient tolerated the procedure well, and there were no immediate periprocedural complications.  The patient was taken to postanesthesia care unit in stable condition.          ______________________________ Brigit Doke, MD     TM/MEDQ  D:  05/07/2013  T:  05/08/2013  Job:  [id number] 

## 2013-05-22 ENCOUNTER — Encounter (HOSPITAL_BASED_OUTPATIENT_CLINIC_OR_DEPARTMENT_OTHER): Payer: Self-pay | Admitting: Anesthesiology

## 2013-05-22 ENCOUNTER — Encounter (HOSPITAL_BASED_OUTPATIENT_CLINIC_OR_DEPARTMENT_OTHER)
Admission: RE | Disposition: A | Discharge status: Home / Self Care | Payer: Self-pay | Source: Ambulatory Visit | Attending: Urology

## 2013-05-22 ENCOUNTER — Other Ambulatory Visit: Payer: Self-pay | Admitting: Urology

## 2013-05-22 ENCOUNTER — Ambulatory Visit (HOSPITAL_BASED_OUTPATIENT_CLINIC_OR_DEPARTMENT_OTHER): Payer: BC Managed Care – PPO | Admitting: Anesthesiology

## 2013-05-22 ENCOUNTER — Ambulatory Visit (HOSPITAL_BASED_OUTPATIENT_CLINIC_OR_DEPARTMENT_OTHER)
Admission: RE | Admit: 2013-05-22 | Discharge: 2013-05-22 | Disposition: A | Discharge status: Home / Self Care | Payer: BC Managed Care – PPO | Source: Ambulatory Visit | Attending: Urology | Admitting: Urology

## 2013-05-22 ENCOUNTER — Encounter (HOSPITAL_BASED_OUTPATIENT_CLINIC_OR_DEPARTMENT_OTHER): Payer: Self-pay | Admitting: *Deleted

## 2013-05-22 DIAGNOSIS — Z466 Encounter for fitting and adjustment of urinary device: Secondary | ICD-10-CM | POA: Insufficient documentation

## 2013-05-22 DIAGNOSIS — Z79899 Other long term (current) drug therapy: Secondary | ICD-10-CM | POA: Insufficient documentation

## 2013-05-22 DIAGNOSIS — F17200 Nicotine dependence, unspecified, uncomplicated: Secondary | ICD-10-CM | POA: Insufficient documentation

## 2013-05-22 DIAGNOSIS — K219 Gastro-esophageal reflux disease without esophagitis: Secondary | ICD-10-CM | POA: Insufficient documentation

## 2013-05-22 DIAGNOSIS — N200 Calculus of kidney: Secondary | ICD-10-CM | POA: Insufficient documentation

## 2013-05-22 HISTORY — PX: CYSTOSCOPY W/ URETERAL STENT REMOVAL: SHX1430

## 2013-05-22 LAB — POCT I-STAT 4, (NA,K, GLUC, HGB,HCT)
Glucose, Bld: 86 mg/dL (ref 70–99)
Hemoglobin: 13.6 g/dL (ref 12.0–15.0)
Potassium: 3.8 mEq/L (ref 3.5–5.1)

## 2013-05-22 LAB — POCT PREGNANCY, URINE: Preg Test, Ur: NEGATIVE

## 2013-05-22 SURGERY — REMOVAL, STENT, URETER, CYSTOSCOPIC
Anesthesia: Monitor Anesthesia Care | Site: Ureter | Laterality: Bilateral | Wound class: Clean Contaminated

## 2013-05-22 MED ORDER — LACTATED RINGERS IV SOLN
INTRAVENOUS | Status: DC
  Administered 2013-05-22 (×2): via INTRAVENOUS
  Filled 2013-05-22: qty 1000

## 2013-05-22 MED ORDER — FENTANYL CITRATE 0.05 MG/ML IJ SOLN
25.0000 ug | INTRAMUSCULAR | Status: DC | PRN
  Filled 2013-05-22: qty 1

## 2013-05-22 MED ORDER — PROMETHAZINE HCL 25 MG/ML IJ SOLN
6.2500 mg | INTRAMUSCULAR | Status: DC | PRN
  Administered 2013-05-22: 6.25 mg via INTRAVENOUS
  Filled 2013-05-22: qty 1

## 2013-05-22 MED ORDER — LIDOCAINE HCL (CARDIAC) 20 MG/ML IV SOLN
INTRAVENOUS | Status: DC | PRN
  Administered 2013-05-22: 50 mg via INTRAVENOUS

## 2013-05-22 MED ORDER — PROPOFOL 10 MG/ML IV EMUL
INTRAVENOUS | Status: DC | PRN
  Administered 2013-05-22: 50 ug/kg/min via INTRAVENOUS

## 2013-05-22 MED ORDER — STERILE WATER FOR IRRIGATION IR SOLN
Status: DC | PRN
  Administered 2013-05-22: 3000 mL

## 2013-05-22 MED ORDER — FENTANYL CITRATE 0.05 MG/ML IJ SOLN
INTRAMUSCULAR | Status: DC | PRN
  Administered 2013-05-22: 100 ug via INTRAVENOUS

## 2013-05-22 MED ORDER — CEFAZOLIN SODIUM-DEXTROSE 2-3 GM-% IV SOLR
2.0000 g | Freq: Once | INTRAVENOUS | Status: AC
  Administered 2013-05-22: 2 g via INTRAVENOUS
  Filled 2013-05-22: qty 50

## 2013-05-22 MED ORDER — OXYCODONE-ACETAMINOPHEN 5-325 MG PO TABS: 1.0000 | ORAL_TABLET | ORAL | Status: DC | PRN

## 2013-05-22 MED ORDER — METOCLOPRAMIDE HCL 5 MG/ML IJ SOLN
5.0000 mg | Freq: Once | INTRAMUSCULAR | Status: AC
  Administered 2013-05-22: 5 mg via INTRAVENOUS
  Filled 2013-05-22: qty 1

## 2013-05-22 MED ORDER — MIDAZOLAM HCL 5 MG/5ML IJ SOLN
INTRAMUSCULAR | Status: DC | PRN
  Administered 2013-05-22: 2 mg via INTRAVENOUS

## 2013-05-22 SURGICAL SUPPLY — 19 items
BAG URO CATCHER STRL LF (DRAPE) ×2 IMPLANT
BASKET LASER NITINOL 1.9FR (BASKET) IMPLANT
BASKET ZERO TIP NITINOL 2.4FR (BASKET) IMPLANT
CATH FOLEY 2WAY  3CC  8FR (CATHETERS)
CATH FOLEY 2WAY 3CC 8FR (CATHETERS) IMPLANT
CATH INTERMIT  6FR 70CM (CATHETERS) IMPLANT
CLOTH BEACON ORANGE TIMEOUT ST (SAFETY) ×2 IMPLANT
DRAPE CAMERA CLOSED 9X96 (DRAPES) ×2 IMPLANT
GLOVE BIO SURGEON STRL SZ7 (GLOVE) ×2 IMPLANT
GLOVE BIOGEL M 7.0 STRL (GLOVE) ×2 IMPLANT
GOWN PREVENTION PLUS XLARGE (GOWN DISPOSABLE) ×2 IMPLANT
GOWN STRL NON-REIN LRG LVL3 (GOWN DISPOSABLE) ×2 IMPLANT
GUIDEWIRE ANG ZIPWIRE 038X150 (WIRE) IMPLANT
GUIDEWIRE STR DUAL SENSOR (WIRE) IMPLANT
IV NS IRRIG 3000ML ARTHROMATIC (IV SOLUTION) IMPLANT
PACK CYSTOSCOPY (CUSTOM PROCEDURE TRAY) ×2 IMPLANT
SYRINGE 10CC LL (SYRINGE) IMPLANT
TUBE FEEDING 8FR 16IN STR KANG (MISCELLANEOUS) IMPLANT
WATER STERILE IRR 3000ML UROMA (IV SOLUTION) ×2 IMPLANT

## 2013-05-22 NOTE — Anesthesia Postprocedure Evaluation (Signed)
  Anesthesia Post-op Note  Patient: [name]  Procedure(s) Performed: Procedure(s) (LRB): CYSTOSCOPY WITH STENT REMOVAL (Bilateral)  Patient Location: PACU  Anesthesia Type: MAC  Level of Consciousness: awake and alert   Airway and Oxygen Therapy: Patient Spontanous Breathing  Post-op Pain: mild  Post-op Assessment: Post-op Vital signs reviewed, Patient's Cardiovascular Status Stable, Respiratory Function Stable, Patent Airway and No signs of Nausea or vomiting  Last Vitals:  Filed Vitals:   05/22/13 1430  BP: 97/66  Pulse: 55  Temp: 36.2 C  Resp: 14    Post-op Vital Signs: stable   Complications: No apparent anesthesia complications

## 2013-05-22 NOTE — Anesthesia Preprocedure Evaluation (Addendum)
Anesthesia Evaluation  Patient identified by MRN, date of birth, ID band Patient awake  General Assessment Comment:.  Headache(784.0)     .  Asthma     .  Ureteral calculi         BILATERAL   .  History of syncope         2011--  W/ PALPITATIONS  --  ECHO NORMAL   .  Anxiety         panic attacks   .  Dysrhythmia         pt states she's scheduled to waer halter monitor in July d/t "skipped beats"   .  GERD (gastroesophageal reflux disease)         hx of " stomach ulcers"        Reviewed: Allergy & Precautions, H&P , NPO status , Patient's Chart, lab work & pertinent test results  Airway Mallampati: II TM Distance: >3 FB Neck ROM: Full    Dental no notable dental hx.    Pulmonary asthma , Current Smoker,  breath sounds clear to auscultation  Pulmonary exam normal       Cardiovascular Exercise Tolerance: Good + dysrhythmias Rhythm:Regular Rate:Normal  H/O syncope with Palpitations. Dr. Ludwig Clarks note from January 2012 reviewed. Echo normal.  She has undergone many holter evaluations without significant finding.   Neuro/Psych  Headaches, Anxiety    GI/Hepatic Neg liver ROS, GERD-  Medicated,  Endo/Other  negative endocrine ROS  Renal/GU Renal disease  negative genitourinary   Musculoskeletal negative musculoskeletal ROS (+)   Abdominal   Peds negative pediatric ROS (+)  Hematology negative hematology ROS (+)   Anesthesia Other Findings   Reproductive/Obstetrics Negative pregnancy test 05-07-13.                          Anesthesia Physical Anesthesia Plan  ASA: II  Anesthesia Plan: MAC   Post-op Pain Management:    Induction: Intravenous  Airway Management Planned:   Additional Equipment:   Intra-op Plan:   Post-operative Plan: Extubation in OR  Informed Consent: I have reviewed the patients History and Physical, chart, labs and discussed the procedure including the  risks, benefits and alternatives for the proposed anesthesia with the patient or authorized representative who has indicated his/her understanding and acceptance.   Dental advisory given  Plan Discussed with: CRNA  Anesthesia Plan Comments:         Anesthesia Quick Evaluation

## 2013-05-22 NOTE — Transfer of Care (Signed)
Immediate Anesthesia Transfer of Care Note  Patient: [name]  Procedure(s) Performed: Procedure(s) with comments: CYSTOSCOPY WITH STENT REMOVAL (Bilateral) - cysto and bilateral stent pull  Patient Location: PACU  Anesthesia Type:MAC  Level of Consciousness: awake, alert  and oriented  Airway & Oxygen Therapy: Patient Spontanous Breathing  Post-op Assessment: Report given to PACU RN  Post vital signs: Reviewed and stable  Complications: No apparent anesthesia complications

## 2013-05-22 NOTE — H&P (Signed)
[name] is an 27 y.o. female.    Chief Complaint: Pre-Op Bilateral Ureteral Stent Pull  HPI:   1 - Surgical Nephrolithiasis / Retained Ureteral Stents -  2007 - Rt URS by Grapey  11/2012 - ER visit for 3mm Rt UVJ stone. CT at time with two left 3mm intrarenal stones --> Bilateral ureteroscopic stone manipulation with bilateral stents 06/06/2013.  PMH sig for arrythmia (neg Halter eval prior). NO CV disese. No blood thinners.   Today [name] is seen for cysto and stent removal under anesthesia. She had attempted stent removal earlier today in the office, but she did not tolerate secondary to significant pain, anxiety, as well as body habitus. She is a very petite lady, nearly pediatric size, making traditional instruments somewhat large in proportion. Denies interval fevers.   Past Medical History  Diagnosis Date  . Headache(784.0)   . Asthma   . Ureteral calculi     BILATERAL  . History of syncope     2011--  W/ PALPITATIONS  --  ECHO NORMAL  . Anxiety     panic attacks  . Dysrhythmia     pt states she's scheduled to waer halter monitor in July d/t "skipped beats"  . GERD (gastroesophageal reflux disease)     hx of " stomach ulcers"    Past Surgical History  Procedure Laterality Date  . Cervix lesion destruction    . Wisdom tooth extraction    . Tympanostomy tube placement    . Tonsillectomy and adenoidectomy    . Left ureterscopic stone extraction  07-05-2006  . Extracorporeal shock wave lithotripsy    . Transthoracic echocardiogram  10-11-2010    NORMAL LVF/ EF 55-65%/ NORMAL VALVE'S  . Cystoscopy with retrograde pyelogram, ureteroscopy and stent placement Bilateral 05/07/2013    Procedure: CYSTOSCOPY WITH RETROGRADE PYELOGRAM, URETEROSCOPY,  BASKET STONE EXTRACTIONS AND STENT PLACEMENT, BILATERAL;  Surgeon: Sebastian Ache, MD;  Location: [facility];  Service: Urology;  Laterality: Bilateral;    Family History  Problem Relation Age of Onset  .  Diabetes Mother   . Arthritis Mother   . Hypertension Mother   . Heart disease Mother   . Hyperlipidemia Mother   . Cancer Father   . Arthritis Father   . Cancer Maternal Aunt   . Leukemia Maternal Aunt   . Hyperlipidemia Maternal Aunt   . Cancer Maternal Uncle   . Hyperlipidemia Maternal Uncle   . Cancer Paternal Aunt   . Arthritis Maternal Grandmother   . Hyperlipidemia Maternal Grandmother   . Heart disease Maternal Grandfather   . Hyperlipidemia Maternal Grandfather   . Cancer Paternal Grandfather    Social History:  reports that she has been smoking Cigarettes.  She has a 3 pack-year smoking history. She does not have any smokeless tobacco history on file. She reports that  drinks alcohol. She reports that she does not use illicit drugs.  Allergies: No Known Allergies  Medications Prior to Admission  Medication Sig Dispense Refill  . ibuprofen (ADVIL,MOTRIN) 200 MG tablet Take 200 mg by mouth every 6 (six) hours as needed. headache      . lansoprazole (PREVACID) 15 MG capsule Take 15 mg by mouth daily as needed.      . Norethin Ace-Eth Estrad-FE (MINASTRIN 24 FE PO) Take by mouth.      . oxybutynin (DITROPAN) 5 MG tablet Take 1 tablet (5 mg total) by mouth every 8 (eight) hours as needed. For bladder spasms / stent discomfort  30 tablet  2  . oxyCODONE-acetaminophen (ROXICET) 5-325 MG per tablet Take 1 tablet by mouth every 4 (four) hours as needed for pain.  40 tablet  0  . promethazine (PHENERGAN) 25 MG tablet Take 1 tablet (25 mg total) by mouth every 6 (six) hours as needed for nausea.  30 tablet  0  . diazepam (VALIUM) 5 MG tablet Take 5 mg by mouth every 6 (six) hours as needed. anxiety      . MedroxyPROGESTERone Acetate (DEPO-PROVERA IM) Inject into the muscle.      . ondansetron (ZOFRAN) 4 MG tablet Take 4 mg by mouth every 8 (eight) hours as needed. nausea      . promethazine (PHENERGAN) 25 MG suppository Place 1 suppository (25 mg total) rectally every 6 (six) hours  as needed for nausea (may also use intravaginally).  12 each  0  . sennosides-docusate sodium (SENOKOT-S) 8.6-50 MG tablet Take 1 tablet by mouth 2 (two) times daily. While taking pain meds to prevent constipation  30 tablet  1    Results for orders placed during the hospital encounter of 05/22/13 (from the past 48 hour(s))  POCT I-STAT 4, (NA,K, GLUC, HGB,HCT)     Status: None   Collection Time    05/22/13 12:31 PM      Result Value Range   Sodium 140  135 - 145 mEq/L   Potassium 3.8  3.5 - 5.1 mEq/L   Glucose, Bld 86  70 - 99 mg/dL   HCT 16.1  09.6 - 04.5 %   Hemoglobin 13.6  12.0 - 15.0 g/dL  POCT PREGNANCY, URINE     Status: None   Collection Time    05/22/13  1:05 PM      Result Value Range   Preg Test, Ur NEGATIVE  NEGATIVE   Comment:            THE SENSITIVITY OF THIS     METHODOLOGY IS >24 mIU/mL   No results found.  Review of Systems  Constitutional: Negative.  Negative for fever and chills.  HENT: Negative.   Eyes: Negative.   Respiratory: Negative.   Cardiovascular: Negative.   Gastrointestinal: Negative.   Genitourinary: Positive for urgency and frequency. Negative for flank pain.  Musculoskeletal: Negative.   Skin: Negative.   Neurological: Negative.   Endo/Heme/Allergies: Negative.   Psychiatric/Behavioral: Negative.     Blood pressure 96/66, pulse 79, temperature 97.2 F (36.2 C), temperature source Oral, resp. rate 16, weight 48.988 kg (108 lb), last menstrual period 05/05/2013, SpO2 99.00%. Physical Exam  Constitutional: She is oriented to person, place, and time. She appears well-developed and well-nourished.  HENT:  Head: Normocephalic and atraumatic.  Eyes: EOM are normal. Pupils are equal, round, and reactive to light.  Neck: Normal range of motion. Neck supple.  Cardiovascular: Normal rate and regular rhythm.   Respiratory: Effort normal and breath sounds normal.  GI: Soft.  Genitourinary:  No CVAT  Musculoskeletal: Normal range of motion.   Neurological: She is alert and oriented to person, place, and time.  Skin: Skin is warm and dry.  Psychiatric: She has a normal mood and affect. Her behavior is normal. Judgment and thought content normal.     Assessment/Plan  1 - Surgical Nephrolithiasis / Retained Ureteral Stents - Proceed with stent removal under anesthesia today. Risks including bleeding, infection, return of colic, as well as anaesthetic risks and rare risks such as loss of kidney , MI, PE, DVT, CVA, Mortality discussed.  [name] 05/22/2013, 1:55 PM

## 2013-05-22 NOTE — Brief Op Note (Signed)
05/22/2013  2:16 PM  PATIENT:  [name]  27 y.o. female  PRE-OPERATIVE DIAGNOSIS:  retained bil stents  POST-OPERATIVE DIAGNOSIS:  * No post-op diagnosis entered *  PROCEDURE:  Procedure(s) with comments: CYSTOSCOPY WITH STENT REMOVAL (Bilateral) - cysto and bilateral stent pull  SURGEON:  Surgeon(s) and Role:    * Sebastian Ache, MD - Primary  PHYSICIAN ASSISTANT:   ASSISTANTS: none   ANESTHESIA:   MAC  EBL:  Total I/O In: 200 [I.V.:200] Out: -   BLOOD ADMINISTERED:none  DRAINS: none   LOCAL MEDICATIONS USED:  NONE  SPECIMEN:  Source of Specimen:  Rt and Lt Ureteral Stents  DISPOSITION OF SPECIMEN:  Discard  COUNTS:  YES  TOURNIQUET:  * No tourniquets in log *  DICTATION: .Other Dictation: Dictation Number [id number]  PLAN OF CARE: Discharge to home after PACU  PATIENT DISPOSITION:  PACU - hemodynamically stable.   Delay start of Pharmacological VTE agent (>24hrs) due to surgical blood loss or risk of bleeding: not applicable

## 2013-05-23 ENCOUNTER — Encounter (HOSPITAL_BASED_OUTPATIENT_CLINIC_OR_DEPARTMENT_OTHER): Payer: Self-pay | Admitting: Urology

## 2013-05-23 NOTE — Op Note (Signed)
[name], [name]              ACCOUNT NO.:  [id number]  MEDICAL RECORD NO.:  [id number]  LOCATION:  WA16                         FACILITY:  [facility]  PHYSICIAN:  Sebastian Ache, MD     DATE OF BIRTH:  08-Jan-1986  DATE OF PROCEDURE: 05/22/2013                               OPERATIVE REPORT   DIAGNOSIS:  Bilateral retained ureteral stents.  PROCEDURE:  Cystoscopy with bilateral ureteral stent removal.  COMPLICATIONS:  None.  SPECIMEN:  Bilateral ureteral stents for discard.  INDICATIONS:  [name] is a very pleasant 27 year old young lady with history of recurrent nephrolithiasis.  She underwent bilateral ureteroscopic stone manipulation.  Recently with goal of stone free which we achieved clinically.  She presented to the office today for elective stent removal.  She underwent a brief attempted office removal which she did not tolerate due to body habitus as well as pain and anxiety.  Options were discussed including removal under anesthesia and she wished to proceed.  Notably, the patient is very, very petite, almost pediatric size making traditional instrumentation in the office setting very disproportionately large and her level of discomfort is certainly understandable.  Informed consent was obtained and placed in the medical record.  PROCEDURE IN DETAIL:  The patient being [name], was verified. Procedure being cystoscopy with bilateral ureteral stent was confirmed. Procedure was carried out.  Time-out was performed.  Intravenous antibiotics were administered.  General monitored anesthesia care sedation was administered.  A 22-French rigid scope with obturator was used to enter the urinary bladder.  After which, cystoscopy was performed using 12-degree lens, bilateral distal ends of stents were grasped and removed in their entirety and set aside for discard.  Both of them were inspected and intact.  Bladder was emptied per cystoscope. Procedure was then  terminated.  The patient tolerated the procedure well.  There were no immediate periprocedural complications.  The patient was taken to the postanesthesia care unit in stable condition.          ______________________________ Sebastian Ache, MD     TM/MEDQ  D:  05/22/2013  T:  05/23/2013  Job:  [id number]

## 2013-07-08 ENCOUNTER — Other Ambulatory Visit (HOSPITAL_COMMUNITY): Payer: Self-pay | Admitting: Obstetrics and Gynecology

## 2013-07-08 NOTE — H&P (Signed)
[name]  DICTATION # [id number] CSN# [id number]   Meriel Pica, MD 07/08/2013 1:57 PM

## 2013-07-09 NOTE — H&P (Signed)
NAME:  Martin, Kathleen                   ACCOUNT NO.:  MEDICAL RECORD NO.:  LOCATION:                                 FACILITY:  PHYSICIAN:  Dmonte Maher M. Marcelle Overlie, M.D.    DATE OF BIRTH:  DATE OF ADMISSION: DATE OF DISCHARGE:                             HISTORY & PHYSICAL   CHIEF COMPLAINT:  Missed AB.  HPI:  A 27 year old, G1, P0.  The patient was seen recently for positive UPT, early ultrasound demonstrated on June 16, 2013, early IUP. Followup ultrasound June 25, 2013, showed 6 weeks 4 days.  FHR was low normal at 95.  She came back for followup ultrasound, not having any cramping or bleeding, but unfortunately last ultrasound showed fetus with no cardiac activity.  Presents now for D and E for missed AB.  PAST MEDICAL HISTORY:  She is currently on prenatal vitamins.  ALLERGIES:  None.  REVIEW OF SYSTEMS:  Significant for history of kidney stones in 2007. She has had a tonsillectomy and wisdom teeth extraction.  Review of systems significant for prior history of abnormal Pap, mild asthma, gastritis, UTI.  FAMILY HISTORY:  Significant for gallbladder disease, arthritis, diabetes, and breast and colon cancer.  SOCIAL HISTORY:  She is a single.  Smokes less than 1 PPD.  Denies alcohol or drug use.  PHYSICAL EXAMINATION:  GENERAL:  Temp 98.2, blood pressure 102/70. HEENT:  Unremarkable. NECK:  Supple without masses. LUNGS:  Clear. CARDIOVASCULAR:  Regular rate and rhythm without murmurs, rubs, gallops. BREASTS:  Without masses. ABDOMEN:  Soft, flat, nontender.  Vulva, vagina, cervix normal.  Uterus was 7-week size mid position.  Adnexa negative. EXTREMITIES:  Unremarkable. NEUROLOGIC:  Unremarkable.  IMPRESSION:  Missed AB.  PLAN:  D and E.  This procedure including specific risks related to bleeding, infection, other complications, may require additional surgery such as uterine perforation reviewed with her which she understands and accepts.     Brynnleigh Mcelwee M.  Marcelle Overlie, M.D.     RMH/MEDQ  D:  07/08/2013  T:  07/09/2013  Job:  [id number]

## 2013-07-10 ENCOUNTER — Encounter (HOSPITAL_COMMUNITY): Payer: Self-pay | Admitting: Radiology

## 2013-07-10 ENCOUNTER — Encounter (HOSPITAL_COMMUNITY)
Admission: RE | Disposition: A | Discharge status: Home / Self Care | Payer: Self-pay | Source: Ambulatory Visit | Attending: Obstetrics and Gynecology

## 2013-07-10 ENCOUNTER — Ambulatory Visit (HOSPITAL_COMMUNITY)
Admission: RE | Admit: 2013-07-10 | Discharge: 2013-07-10 | Disposition: A | Discharge status: Home / Self Care | Payer: BC Managed Care – PPO | Source: Ambulatory Visit | Attending: Obstetrics and Gynecology | Admitting: Obstetrics and Gynecology

## 2013-07-10 ENCOUNTER — Encounter (HOSPITAL_COMMUNITY): Payer: Self-pay | Admitting: Anesthesiology

## 2013-07-10 ENCOUNTER — Ambulatory Visit (HOSPITAL_COMMUNITY): Payer: BC Managed Care – PPO | Admitting: Anesthesiology

## 2013-07-10 ENCOUNTER — Ambulatory Visit (HOSPITAL_COMMUNITY): Payer: BC Managed Care – PPO

## 2013-07-10 DIAGNOSIS — O021 Missed abortion: Secondary | ICD-10-CM

## 2013-07-10 HISTORY — PX: DILATION AND EVACUATION: SHX1459

## 2013-07-10 LAB — CBC
Hemoglobin: 12.9 g/dL (ref 12.0–15.0)
MCHC: 35.1 g/dL (ref 30.0–36.0)
WBC: 11.2 10*3/uL — ABNORMAL HIGH (ref 4.0–10.5)

## 2013-07-10 SURGERY — DILATION AND EVACUATION, UTERUS
Anesthesia: Monitor Anesthesia Care | Site: Vagina | Wound class: Clean Contaminated

## 2013-07-10 MED ORDER — FENTANYL CITRATE 0.05 MG/ML IJ SOLN
INTRAMUSCULAR | Status: AC
  Filled 2013-07-10: qty 5

## 2013-07-10 MED ORDER — PROMETHAZINE HCL 25 MG/ML IJ SOLN
6.2500 mg | INTRAMUSCULAR | Status: DC | PRN
  Administered 2013-07-10: 6.25 mg via INTRAVENOUS

## 2013-07-10 MED ORDER — CEFAZOLIN SODIUM-DEXTROSE 2-3 GM-% IV SOLR: 2.0000 g | INTRAVENOUS | Status: DC

## 2013-07-10 MED ORDER — PROPOFOL 10 MG/ML IV EMUL
INTRAVENOUS | Status: AC
  Filled 2013-07-10: qty 20

## 2013-07-10 MED ORDER — DEXAMETHASONE SODIUM PHOSPHATE 10 MG/ML IJ SOLN
INTRAMUSCULAR | Status: AC
  Filled 2013-07-10: qty 1

## 2013-07-10 MED ORDER — MIDAZOLAM HCL 2 MG/2ML IJ SOLN
INTRAMUSCULAR | Status: AC
  Filled 2013-07-10: qty 2

## 2013-07-10 MED ORDER — LACTATED RINGERS IV SOLN
INTRAVENOUS | Status: DC
  Administered 2013-07-10: 125 mL/h via INTRAVENOUS

## 2013-07-10 MED ORDER — FENTANYL CITRATE 0.05 MG/ML IJ SOLN: 25.0000 ug | INTRAMUSCULAR | Status: DC | PRN

## 2013-07-10 MED ORDER — HYDROCODONE-IBUPROFEN 7.5-200 MG PO TABS: 1.0000 | ORAL_TABLET | Freq: Three times a day (TID) | ORAL | Status: DC | PRN

## 2013-07-10 MED ORDER — ONDANSETRON HCL 4 MG/2ML IJ SOLN
INTRAMUSCULAR | Status: AC
  Filled 2013-07-10: qty 2

## 2013-07-10 MED ORDER — DEXAMETHASONE SODIUM PHOSPHATE 10 MG/ML IJ SOLN
INTRAMUSCULAR | Status: DC | PRN
  Administered 2013-07-10: 10 mg via INTRAVENOUS

## 2013-07-10 MED ORDER — PROPOFOL 10 MG/ML IV BOLUS
INTRAVENOUS | Status: DC | PRN
  Administered 2013-07-10 (×2): 20 mg via INTRAVENOUS

## 2013-07-10 MED ORDER — KETOROLAC TROMETHAMINE 30 MG/ML IJ SOLN
INTRAMUSCULAR | Status: DC | PRN
  Administered 2013-07-10: 30 mg via INTRAVENOUS

## 2013-07-10 MED ORDER — PROPOFOL INFUSION 10 MG/ML OPTIME
INTRAVENOUS | Status: DC | PRN
  Administered 2013-07-10: 100 ug/kg/min via INTRAVENOUS

## 2013-07-10 MED ORDER — SILVER NITRATE-POT NITRATE 75-25 % EX MISC
CUTANEOUS | Status: AC
  Filled 2013-07-10: qty 1

## 2013-07-10 MED ORDER — SCOPOLAMINE 1 MG/3DAYS TD PT72
MEDICATED_PATCH | TRANSDERMAL | Status: AC
  Filled 2013-07-10: qty 1

## 2013-07-10 MED ORDER — ONDANSETRON HCL 4 MG/2ML IJ SOLN
INTRAMUSCULAR | Status: DC | PRN
  Administered 2013-07-10: 4 mg via INTRAVENOUS

## 2013-07-10 MED ORDER — RHO D IMMUNE GLOBULIN 1500 UNIT/2ML IJ SOLN
300.0000 ug | Freq: Once | INTRAMUSCULAR | Status: AC
  Filled 2013-07-10: qty 2

## 2013-07-10 MED ORDER — MIDAZOLAM HCL 5 MG/5ML IJ SOLN
INTRAMUSCULAR | Status: DC | PRN
  Administered 2013-07-10: 2 mg via INTRAVENOUS

## 2013-07-10 MED ORDER — LIDOCAINE HCL 1 % IJ SOLN
INTRAMUSCULAR | Status: DC | PRN
  Administered 2013-07-10: 8 mL

## 2013-07-10 MED ORDER — PROMETHAZINE HCL 25 MG/ML IJ SOLN
INTRAMUSCULAR | Status: AC
  Administered 2013-07-10: 6.25 mg via INTRAVENOUS
  Filled 2013-07-10: qty 1

## 2013-07-10 MED ORDER — RHO D IMMUNE GLOBULIN 1500 UNIT/2ML IJ SOLN
300.0000 ug | Freq: Once | INTRAMUSCULAR | Status: AC
Start: 2013-07-10 — End: 2013-07-10
  Administered 2013-07-10: 300 ug via INTRAVENOUS
  Filled 2013-07-10: qty 2

## 2013-07-10 MED ORDER — KETOROLAC TROMETHAMINE 30 MG/ML IJ SOLN
INTRAMUSCULAR | Status: AC
  Filled 2013-07-10: qty 1

## 2013-07-10 MED ORDER — LIDOCAINE HCL (CARDIAC) 20 MG/ML IV SOLN
INTRAVENOUS | Status: DC | PRN
  Administered 2013-07-10: 50 mg via INTRAVENOUS

## 2013-07-10 MED ORDER — FENTANYL CITRATE 0.05 MG/ML IJ SOLN
INTRAMUSCULAR | Status: DC | PRN
  Administered 2013-07-10: 100 ug via INTRAVENOUS

## 2013-07-10 MED ORDER — KETOROLAC TROMETHAMINE 30 MG/ML IJ SOLN: 15.0000 mg | Freq: Once | INTRAMUSCULAR | Status: DC | PRN

## 2013-07-10 MED ORDER — SCOPOLAMINE 1 MG/3DAYS TD PT72
1.0000 | MEDICATED_PATCH | TRANSDERMAL | Status: DC
  Administered 2013-07-10: 1 via TRANSDERMAL

## 2013-07-10 MED ORDER — LIDOCAINE HCL (CARDIAC) 20 MG/ML IV SOLN
INTRAVENOUS | Status: AC
  Filled 2013-07-10: qty 5

## 2013-07-10 MED ORDER — MEPERIDINE HCL 25 MG/ML IJ SOLN: 6.2500 mg | INTRAMUSCULAR | Status: DC | PRN

## 2013-07-10 MED ORDER — FENTANYL CITRATE 0.05 MG/ML IJ SOLN
INTRAMUSCULAR | Status: AC
  Filled 2013-07-10: qty 2

## 2013-07-10 MED ORDER — LIDOCAINE HCL 0.5 % IJ SOLN
INTRAMUSCULAR | Status: AC
  Filled 2013-07-10: qty 1

## 2013-07-10 SURGICAL SUPPLY — 18 items
CATH ROBINSON RED A/P 16FR (CATHETERS) ×2 IMPLANT
CLOTH BEACON ORANGE TIMEOUT ST (SAFETY) ×2 IMPLANT
DECANTER SPIKE VIAL GLASS SM (MISCELLANEOUS) ×2 IMPLANT
GLOVE BIO SURGEON STRL SZ7 (GLOVE) ×2 IMPLANT
GOWN STRL REIN XL XLG (GOWN DISPOSABLE) ×4 IMPLANT
KIT BERKELEY 1ST TRIMESTER 3/8 (MISCELLANEOUS) ×2 IMPLANT
NEEDLE SPNL 22GX3.5 QUINCKE BK (NEEDLE) ×2 IMPLANT
NS IRRIG 1000ML POUR BTL (IV SOLUTION) ×2 IMPLANT
PACK VAGINAL MINOR WOMEN LF (CUSTOM PROCEDURE TRAY) ×2 IMPLANT
PAD OB MATERNITY 4.3X12.25 (PERSONAL CARE ITEMS) ×2 IMPLANT
PAD PREP 24X48 CUFFED NSTRL (MISCELLANEOUS) ×2 IMPLANT
SET BERKELEY SUCTION TUBING (SUCTIONS) ×2 IMPLANT
SYR CONTROL 10ML LL (SYRINGE) ×2 IMPLANT
TOWEL OR 17X24 6PK STRL BLUE (TOWEL DISPOSABLE) ×4 IMPLANT
VACURETTE 10 RIGID CVD (CANNULA) IMPLANT
VACURETTE 7MM CVD STRL WRAP (CANNULA) ×2 IMPLANT
VACURETTE 8 RIGID CVD (CANNULA) IMPLANT
VACURETTE 9 RIGID CVD (CANNULA) IMPLANT

## 2013-07-10 NOTE — Progress Notes (Signed)
The patient was re-examined with no change in status. She had requested another [place], done this am>>>NO FHR//rmh

## 2013-07-10 NOTE — Anesthesia Preprocedure Evaluation (Signed)
Anesthesia Evaluation    Reviewed: Allergy & Precautions, H&P , Patient's Chart, lab work & pertinent test results, reviewed documented beta blocker date and time   Airway Mallampati: I TM Distance: >3 FB Neck ROM: full    Dental no notable dental hx. (+) Teeth Intact   Pulmonary    Pulmonary exam normal       Cardiovascular negative cardio ROS      Neuro/Psych negative psych ROS   GI/Hepatic Neg liver ROS, GERD-  Medicated and Controlled,  Endo/Other  negative endocrine ROS  Renal/GU negative Renal ROS     Musculoskeletal negative musculoskeletal ROS (+)   Abdominal Normal abdominal exam  (+)   Peds  Hematology negative hematology ROS (+)   Anesthesia Other Findings   Reproductive/Obstetrics negative OB ROS                           Anesthesia Physical Anesthesia Plan  ASA: II  Anesthesia Plan: MAC   Post-op Pain Management:    Induction:   Airway Management Planned:   Additional Equipment:   Intra-op Plan:   Post-operative Plan:   Informed Consent: I have reviewed the patients History and Physical, chart, labs and discussed the procedure including the risks, benefits and alternatives for the proposed anesthesia with the patient or authorized representative who has indicated his/her understanding and acceptance.     Plan Discussed with: CRNA and Surgeon  Anesthesia Plan Comments:         Anesthesia Quick Evaluation

## 2013-07-10 NOTE — OR Nursing (Signed)
[place] was given iv with normal saline infusing

## 2013-07-10 NOTE — Transfer of Care (Signed)
Immediate Anesthesia Transfer of Care Note  Patient: [name]  Procedure(s) Performed: Procedure(s): DILATATION AND EVACUATION (N/A)  Patient Location: PACU  Anesthesia Type:MAC  Level of Consciousness: awake, alert  and oriented  Airway & Oxygen Therapy: Patient Spontanous Breathing  Post-op Assessment: Report given to PACU RN and Post -op Vital signs reviewed and stable  Post vital signs: Reviewed and stable  Complications: No apparent anesthesia complications

## 2013-07-10 NOTE — Anesthesia Postprocedure Evaluation (Signed)
  Anesthesia Post-op Note  Patient: [name]  Procedure(s) Performed: Procedure(s): DILATATION AND EVACUATION (N/A)  Patient Location: PACU  Anesthesia Type:MAC  Level of Consciousness: awake, alert  and oriented  Airway and Oxygen Therapy: Patient Spontanous Breathing  Post-op Pain: none  Post-op Assessment: Post-op Vital signs reviewed, Patient's Cardiovascular Status Stable, Respiratory Function Stable, Patent Airway, No signs of Nausea or vomiting and Pain level controlled  Post-op Vital Signs: Reviewed and stable  Complications: No apparent anesthesia complications

## 2013-07-10 NOTE — Op Note (Signed)
Preoperative diagnosis: Missed AB  Postoperative diagnosis: Same  Procedure: D&E  Anesthesia: Gen.  Surgeon: Marcelle Overlie  Complications: None  Procedure and findings:  Patient taken the operating room after an adequate level of general anesthesia was obtained, appropriate timeout for taken, with the legs in stirrups the perineum and vagina were prepped and draped in usual fashion for D&E the bladder was drained. EUA carried out uterus was 6 weeks size mid position adnexa negative. Speculum was positioned, cervix grasped with tenaculum paracervical block was then created by infiltrating at 3 and 9:00 submucosally 5-7 cc of 1% Xylocaine at each site after negative aspiration. The uterus is then sounded to 8 cm progressively dilated to a 27 Pratt, #7 curved suction curet was then used to curette a moderate amount of tissue. When no further tissue could be removed a small blunt curette was used to explore the cavity revealing it to be clean there was minimal bleeding, she tolerated this well went to recovery room in good condition.  Dictated with [facility]  [name] M. Milana Obey.D.

## 2013-07-11 ENCOUNTER — Encounter (HOSPITAL_COMMUNITY): Payer: Self-pay | Admitting: Obstetrics and Gynecology

## 2013-07-11 LAB — RH IG WORKUP (INCLUDES ABO/RH)
ABO/RH(D): A NEG
Unit division: 0

## 2013-08-20 ENCOUNTER — Encounter: Payer: Self-pay | Admitting: Gastroenterology

## 2013-09-19 ENCOUNTER — Encounter: Payer: Self-pay | Admitting: Gastroenterology

## 2013-09-19 ENCOUNTER — Ambulatory Visit (INDEPENDENT_AMBULATORY_CARE_PROVIDER_SITE_OTHER): Payer: BC Managed Care – PPO | Admitting: Gastroenterology

## 2013-09-19 VITALS — BP 108/68 | HR 81 | Ht 62.0 in | Wt 108.0 lb

## 2013-09-19 DIAGNOSIS — R141 Gas pain: Secondary | ICD-10-CM

## 2013-09-19 DIAGNOSIS — R140 Abdominal distension (gaseous): Secondary | ICD-10-CM

## 2013-09-19 DIAGNOSIS — K219 Gastro-esophageal reflux disease without esophagitis: Secondary | ICD-10-CM

## 2013-09-19 MED ORDER — PROMETHAZINE HCL 25 MG PO TABS: 25.0000 mg | ORAL_TABLET | Freq: Four times a day (QID) | ORAL | Status: DC | PRN

## 2013-09-19 MED ORDER — PANTOPRAZOLE SODIUM 40 MG PO TBEC: 40.0000 mg | DELAYED_RELEASE_TABLET | Freq: Two times a day (BID) | ORAL | Status: DC

## 2013-09-19 MED ORDER — HYOSCYAMINE SULFATE 0.125 MG SL SUBL: SUBLINGUAL_TABLET | SUBLINGUAL | Status: DC

## 2013-09-19 NOTE — Progress Notes (Addendum)
    History of Present Illness: This is a 27 year old female accompanied by 2 family members. She relates a history of epigastric pain and middle school with H. pylori diagnosed and treated at the time. She has had problems with recurrent kidney stones and is followed by Dr. Berneice Heinrich. She relates a 6 month history of frequent nausea, regurgitation, heartburn, epigastric pain, chest pain, early satiety, abdominal bloating and occasional diarrhea. She takes Prilosec on rare occasions which has helped her symptoms. She notes frequent morning nausea as well as other times. Her weight is stable. Blood work performed several months ago was unremarkable. Abd/pelvic CT scan earlier this year did not show any gastrointestinal pathology. Denies weight loss,  constipation, change in stool caliber, melena, hematochezia, dysphagia.  Review of Systems: Pertinent positive and negative review of systems were noted in the above HPI section. All other review of systems were otherwise negative.  Current Medications, Allergies, Past Medical History, Past Surgical History, Family History and Social History were reviewed in [employer] record.  Physical Exam: General: Well developed , well nourished, thin, no acute distress Head: Normocephalic and atraumatic Eyes:  sclerae anicteric, EOMI Ears: Normal auditory acuity Mouth: No deformity or lesions Neck: Supple, no masses or thyromegaly Lungs: Clear throughout to auscultation Heart: Regular rate and rhythm; no murmurs, rubs or bruits Abdomen: Soft, non tender and non distended. No masses, hepatosplenomegaly or hernias noted. Normal Bowel sounds Musculoskeletal: Symmetrical with no gross deformities  Skin: No lesions on visible extremities Pulses:  Normal pulses noted Extremities: No clubbing, cyanosis, edema or deformities noted Neurological: Alert oriented x 4, grossly nonfocal Cervical Nodes:  No significant cervical adenopathy Inguinal Nodes:  No significant inguinal adenopathy Psychological:  Alert and cooperative. Normal mood and affect  Assessment and Recommendations:  1. Suspected GERD. Rule out ulcer, gastritis, esophagitis. Begin PPI BID and antireflux measures. Refill Phenergan as needed for nausea control. Minimize ibuprofen. The risks, benefits, and alternatives to endoscopy with possible biopsy and possible dilation were discussed with the patient and they consent to proceed.   2. Abdominal bloating. Levsin 1-2 q4h prn.

## 2013-09-19 NOTE — Patient Instructions (Addendum)
We have sent the following medications to your pharmacy for you to pick up at your convenience: Pantoprazole and Levsin.  You have been scheduled for an endoscopy with propofol. Please follow written instructions given to you at your visit today. If you use inhalers (even only as needed), please bring them with you on the day of your procedure.  Patient advised to avoid spicy, acidic, citrus, chocolate, mints, fruit and fruit juices.  Limit the intake of caffeine, alcohol and Soda.  Don't exercise too soon after eating.  Don't lie down within 3-4 hours of eating.  Elevate the head of your bed.  Minimize your use of ibuprofen.   Thank you for choosing me and [facility].  Venita Lick. Pleas Koch., MD., Clementeen Graham  cc: Gillermina Hu, MD, Sebastian Ache, MD

## 2013-09-30 ENCOUNTER — Telehealth: Payer: Self-pay | Admitting: Gastroenterology

## 2013-09-30 NOTE — Telephone Encounter (Signed)
Yes charge 

## 2013-10-01 ENCOUNTER — Encounter: Payer: BC Managed Care – PPO | Admitting: Gastroenterology

## 2013-11-21 ENCOUNTER — Encounter: Payer: BC Managed Care – PPO | Admitting: Gastroenterology

## 2013-12-02 ENCOUNTER — Ambulatory Visit (AMBULATORY_SURGERY_CENTER): Payer: BC Managed Care – PPO | Admitting: Gastroenterology

## 2013-12-02 ENCOUNTER — Encounter: Payer: Self-pay | Admitting: Gastroenterology

## 2013-12-02 VITALS — BP 121/75 | HR 89 | Temp 97.5°F | Resp 15 | Ht 62.0 in | Wt 108.0 lb

## 2013-12-02 DIAGNOSIS — D131 Benign neoplasm of stomach: Secondary | ICD-10-CM

## 2013-12-02 DIAGNOSIS — R1013 Epigastric pain: Secondary | ICD-10-CM

## 2013-12-02 DIAGNOSIS — K219 Gastro-esophageal reflux disease without esophagitis: Secondary | ICD-10-CM

## 2013-12-02 DIAGNOSIS — R140 Abdominal distension (gaseous): Secondary | ICD-10-CM

## 2013-12-02 MED ORDER — SODIUM CHLORIDE 0.9 % IV SOLN: 500.0000 mL | INTRAVENOUS | Status: DC

## 2013-12-02 NOTE — Op Note (Addendum)
[facility]  [address] [place], [address]   ENDOSCOPY PROCEDURE REPORT  PATIENT: Atiyana, [name]  MR#: [id number] BIRTHDATE: July 17, 1986 , 27  yrs. old GENDER: Female ENDOSCOPIST: Ladene Artist, MD, [facility] REFERRED BY:  Waunita Schooner, NP PROCEDURE DATE:  12/02/2013 PROCEDURE:  EGD w/ biopsy ASA CLASS:     Class II INDICATIONS:  History of esophageal reflux.   Epigastric pain. MEDICATIONS: MAC sedation, administered by CRNA and propofol (Diprivan) 200mg  IV TOPICAL ANESTHETIC: none DESCRIPTION OF PROCEDURE: After the risks benefits and alternatives of the procedure were thoroughly explained, informed consent was obtained.  The LB [id number] endoscope was introduced through the mouth and advanced to the second portion of the duodenum. Without limitations.  The instrument was slowly withdrawn as the mucosa was fully examined.  STOMACH: Mild gastritis, patchy erythema was found in the gastric antrum, gastric body, and gastric fundus.  Multiple biopsies were performed.   The stomach otherwise appeared normal. ESOPHAGUS: The mucosa of the esophagus appeared normal. DUODENUM: Mild patchy erythema in the duodenal bulb. Otherwise the duodenal mucosa showed no abnormalities in the bulb and second portion of the duodenum.  Retroflexed views revealed no abnormalities.   The scope was then withdrawn from the patient and the procedure completed.  COMPLICATIONS: There were no complications.  ENDOSCOPIC IMPRESSION: 1.   Mild gastritis; multiple biopsies 2.   Mild duodenitis 3.   The EGD otherwise appeared normal  RECOMMENDATIONS: 1.  Anti-reflux regimen 2.  Continue PPI 3.  Await pathology results   eSigned:  Ladene Artist, MD, [facility] 12/02/2013 8:32 AM Revised: 12/02/2013 8:32 AM

## 2013-12-02 NOTE — Progress Notes (Signed)
Called to room to assist during endoscopic procedure.  Patient ID and intended procedure confirmed with present staff. Received instructions for my participation in the procedure from the performing physician.  

## 2013-12-02 NOTE — Patient Instructions (Signed)
YOU HAD AN ENDOSCOPIC PROCEDURE TODAY AT THE [facility]: Refer to the procedure report that was given to you for any specific questions about what was found during the examination.  If the procedure report does not answer your questions, please call your gastroenterologist to clarify.  If you requested that your care partner not be given the details of your procedure findings, then the procedure report has been included in a sealed envelope for you to review at your convenience later.  YOU SHOULD EXPECT: Some feelings of bloating in the abdomen. Passage of more gas than usual.  Walking can help get rid of the air that was put into your GI tract during the procedure and reduce the bloating. If you had a lower endoscopy (such as a colonoscopy or flexible sigmoidoscopy) you may notice spotting of blood in your stool or on the toilet paper. If you underwent a bowel prep for your procedure, then you may not have a normal bowel movement for a few days.  DIET: Your first meal following the procedure should be a light meal and then it is ok to progress to your normal diet.  A half-sandwich or bowl of soup is an example of a good first meal.  Heavy or fried foods are harder to digest and may make you feel nauseous or bloated.  Likewise meals heavy in dairy and vegetables can cause extra gas to form and this can also increase the bloating.  Drink plenty of fluids but you should avoid alcoholic beverages for 24 hours.  ACTIVITY: Your care partner should take you home directly after the procedure.  You should plan to take it easy, moving slowly for the rest of the day.  You can resume normal activity the day after the procedure however you should NOT DRIVE or use heavy machinery for 24 hours (because of the sedation medicines used during the test).    SYMPTOMS TO REPORT IMMEDIATELY: A gastroenterologist can be reached at any hour.  During normal business hours, 8:30 AM to 5:00 PM Monday through Friday,  call [phone].  After hours and on weekends, please call the GI answering service at [phone] who will take a message and have the physician on call contact you.    Following upper endoscopy (EGD)  Vomiting of blood or coffee ground material  New chest pain or pain under the shoulder blades  Painful or persistently difficult swallowing  New shortness of breath  Fever of 100F or higher  Black, tarry-looking stools  FOLLOW UP: If any biopsies were taken you will be contacted by phone or by letter within the next 1-3 weeks.  Call your gastroenterologist if you have not heard about the biopsies in 3 weeks.  Our staff will call the home number listed on your records the next business day following your procedure to check on you and address any questions or concerns that you may have at that time regarding the information given to you following your procedure. This is a courtesy call and so if there is no answer at the home number and we have not heard from you through the emergency physician on call, we will assume that you have returned to your regular daily activities without incident.  SIGNATURES/CONFIDENTIALITY: You and/or your care partner have signed paperwork which will be entered into your electronic medical record.  These signatures attest to the fact that that the information above on your After Visit Summary has been reviewed and is understood.  Full   responsibility of the confidentiality of this discharge information lies with you and/or your care-partner.   Resume medications. Information given on gastritis with discharge instructions. 

## 2013-12-02 NOTE — Progress Notes (Signed)
Report to pacu rn, vss, bbs=clear 

## 2013-12-03 ENCOUNTER — Telehealth: Payer: Self-pay

## 2013-12-03 NOTE — Telephone Encounter (Signed)
Left message on answering machine. 

## 2013-12-09 ENCOUNTER — Encounter: Payer: Self-pay | Admitting: Gastroenterology

## 2013-12-11 ENCOUNTER — Telehealth: Payer: Self-pay | Admitting: Gastroenterology

## 2013-12-11 MED ORDER — PROMETHAZINE HCL 25 MG PO TABS: 25.0000 mg | ORAL_TABLET | Freq: Four times a day (QID) | ORAL | Status: DC | PRN

## 2013-12-11 NOTE — Telephone Encounter (Signed)
Patient notified that the results are normal.

## 2014-02-26 ENCOUNTER — Encounter (HOSPITAL_COMMUNITY): Payer: Self-pay | Admitting: Pharmacist

## 2014-03-04 NOTE — H&P (Signed)
[name]  DICTATION # [id number] CSN# [id number]   Margarette Asal, MD 03/04/2014 2:17 PM

## 2014-03-06 NOTE — H&P (Signed)
NAMEVADIS, [name]              ACCOUNT NO.:  [id number]  MEDICAL RECORD NO.:  [id number]  LOCATION:                                 FACILITY:  PHYSICIAN:  Ralene Bathe. Matthew Saras, M.D.DATE OF BIRTH:  [birth year]  DATE OF ADMISSION:  03/12/2014 DATE OF DISCHARGE:                             HISTORY & PHYSICAL   CHIEF COMPLAINT:  Pelvic pain, dysmenorrhea.  HISTORY OF PRESENT ILLNESS:  A 28 year old, G1, P0, A1, currently on Depo-Provera.  This patient was seeing our nurse practitioner recently and was relaying to her that over the last 6 months she has had worsening problems with pelvic pain, irregular bleeding despite the Depo- Provera.  She does have a positive family history of endometriosis with her mother.  She presents at this time for diagnostic laparoscopy to further evaluate her chronic pelvic pain.  This procedure including specific risks related to bleeding, infection, adjacent organ injury and the possible need to complete the surgery by open technique.  All reviewed with her, which she understands and accepts.  The hormonal treatment of endometriosis discussed.  We had reviewed a trial of Lupron versus proceeding with laparoscopy and she preferred the latter.  PAST SURGICAL HISTORY:  She has had a D and E for missed AB.  ALLERGIES:  None.  CURRENT MEDICATIONS:  None.  REVIEW OF SYSTEMS:  Significant for history of kidney stones with some teeth extraction.  Review of systems otherwise positive for history of migraine, UTI.  FAMILY HISTORY:  Significant for heart disease, asthma, thyroid disease, TB, gallbladder disease, kidney disease, arthritis, diabetes, and unspecified cancer.  SOCIAL HISTORY:  She is single.  Denies drug or alcohol use.  She does smoke less than 1 PPD.  PHYSICAL EXAMINATION:  VITAL SIGNS:  Temperature 98.2, blood pressure 120/62. HEENT:  Unremarkable. NECK:  Supple without masses. LUNGS:  Clear. CARDIOVASCULAR:  Regular rate and rhythm  without murmurs, rubs, or gallops. BREASTS:  Without masses. ABDOMEN:  Soft, flat, nontender. GU:  Vulva, vagina, cervix normal.  Uterus mid position, normal size, slightly tender to manipulation.  No definite nodularity.  Adnexa negative. EXTREMITIES:  Unremarkable. NEUROLOGIC:  Unremarkable.  IMPRESSION:  Chronic pelvic pain, rule out endometriosis.  PLAN:  Diagnostic laparoscopy.  Procedure and risks reviewed as above.     Ionia Schey M. Matthew Saras, M.D.   ______________________________ Ralene Bathe. Matthew Saras, M.D.    RMH/MEDQ  D:  03/04/2014  T:  03/04/2014  Job:  [id number]

## 2014-03-11 NOTE — Anesthesia Preprocedure Evaluation (Addendum)
Anesthesia Evaluation  Patient identified by MRN, date of birth, ID band Patient awake  General Assessment Comment:.  Headache(784.0)     .  Asthma     .  Ureteral calculi         BILATERAL   .  History of syncope         2011--  W/ PALPITATIONS  --  ECHO NORMAL   .  Anxiety         panic attacks   .  Dysrhythmia         pt states she's scheduled to waer halter monitor in July d/t "skipped beats"   .  GERD (gastroesophageal reflux disease)         hx of " stomach ulcers"        Reviewed: Allergy & Precautions, H&P , NPO status , Patient's Chart, lab work & pertinent test results  Airway Mallampati: II TM Distance: >3 FB Neck ROM: Full    Dental no notable dental hx.    Pulmonary asthma , Current Smoker,  breath sounds clear to auscultation  Pulmonary exam normal       Cardiovascular Exercise Tolerance: Good + dysrhythmias Rhythm:Regular Rate:Normal  H/O syncope with Palpitations. Dr. Jacalyn Lefevre note from January 2012 reviewed. Echo normal.  She has undergone many holter evaluations without significant finding.   Neuro/Psych  Headaches, Anxiety    GI/Hepatic Neg liver ROS, GERD-  Medicated,  Endo/Other  negative endocrine ROS  Renal/GU Renal disease  negative genitourinary   Musculoskeletal negative musculoskeletal ROS (+)   Abdominal   Peds negative pediatric ROS (+)  Hematology negative hematology ROS (+)   Anesthesia Other Findings   Reproductive/Obstetrics Negative pregnancy test 05-07-13.                          Anesthesia Physical  Anesthesia Plan  ASA: II  Anesthesia Plan: General   Post-op Pain Management:    Induction: Intravenous  Airway Management Planned: Oral ETT  Additional Equipment:   Intra-op Plan:   Post-operative Plan: Extubation in OR  Informed Consent: I have reviewed the patients History and Physical, chart, labs and discussed the procedure  including the risks, benefits and alternatives for the proposed anesthesia with the patient or authorized representative who has indicated his/her understanding and acceptance.   Dental advisory given  Plan Discussed with: CRNA and Surgeon  Anesthesia Plan Comments:        Anesthesia Quick Evaluation

## 2014-03-12 ENCOUNTER — Ambulatory Visit (HOSPITAL_COMMUNITY)
Admission: RE | Admit: 2014-03-12 | Discharge: 2014-03-12 | Disposition: A | Discharge status: Home / Self Care | Payer: BC Managed Care – PPO | Source: Ambulatory Visit | Attending: Obstetrics and Gynecology | Admitting: Obstetrics and Gynecology

## 2014-03-12 ENCOUNTER — Encounter (HOSPITAL_COMMUNITY): Payer: BC Managed Care – PPO | Admitting: Anesthesiology

## 2014-03-12 ENCOUNTER — Ambulatory Visit (HOSPITAL_COMMUNITY): Payer: BC Managed Care – PPO | Admitting: Anesthesiology

## 2014-03-12 ENCOUNTER — Encounter (HOSPITAL_COMMUNITY)
Admission: RE | Disposition: A | Discharge status: Home / Self Care | Payer: Self-pay | Source: Ambulatory Visit | Attending: Obstetrics and Gynecology

## 2014-03-12 ENCOUNTER — Encounter (HOSPITAL_COMMUNITY): Payer: Self-pay | Admitting: Certified Registered"

## 2014-03-12 DIAGNOSIS — J45909 Unspecified asthma, uncomplicated: Secondary | ICD-10-CM | POA: Insufficient documentation

## 2014-03-12 DIAGNOSIS — N946 Dysmenorrhea, unspecified: Secondary | ICD-10-CM | POA: Insufficient documentation

## 2014-03-12 DIAGNOSIS — F410 Panic disorder [episodic paroxysmal anxiety] without agoraphobia: Secondary | ICD-10-CM | POA: Insufficient documentation

## 2014-03-12 DIAGNOSIS — R002 Palpitations: Secondary | ICD-10-CM | POA: Insufficient documentation

## 2014-03-12 DIAGNOSIS — N803 Endometriosis of pelvic peritoneum, unspecified: Secondary | ICD-10-CM | POA: Insufficient documentation

## 2014-03-12 DIAGNOSIS — G8929 Other chronic pain: Secondary | ICD-10-CM | POA: Insufficient documentation

## 2014-03-12 DIAGNOSIS — N949 Unspecified condition associated with female genital organs and menstrual cycle: Secondary | ICD-10-CM | POA: Insufficient documentation

## 2014-03-12 DIAGNOSIS — F17200 Nicotine dependence, unspecified, uncomplicated: Secondary | ICD-10-CM | POA: Insufficient documentation

## 2014-03-12 DIAGNOSIS — K219 Gastro-esophageal reflux disease without esophagitis: Secondary | ICD-10-CM | POA: Insufficient documentation

## 2014-03-12 DIAGNOSIS — N289 Disorder of kidney and ureter, unspecified: Secondary | ICD-10-CM | POA: Insufficient documentation

## 2014-03-12 HISTORY — PX: LAPAROSCOPY: SHX197

## 2014-03-12 LAB — CBC
HCT: 40.2 % (ref 36.0–46.0)
Hemoglobin: 13.6 g/dL (ref 12.0–15.0)
MCH: 29.9 pg (ref 26.0–34.0)
MCHC: 33.8 g/dL (ref 30.0–36.0)
MCV: 88.4 fL (ref 78.0–100.0)
Platelets: 265 10*3/uL (ref 150–400)
RBC: 4.55 MIL/uL (ref 3.87–5.11)
RDW: 12.7 % (ref 11.5–15.5)
WBC: 8.3 10*3/uL (ref 4.0–10.5)

## 2014-03-12 LAB — PREGNANCY, URINE: Preg Test, Ur: NEGATIVE

## 2014-03-12 SURGERY — LAPAROSCOPY, DIAGNOSTIC
Anesthesia: Monitor Anesthesia Care | Site: Abdomen

## 2014-03-12 MED ORDER — KETOROLAC TROMETHAMINE 30 MG/ML IJ SOLN: 15.0000 mg | Freq: Once | INTRAMUSCULAR | Status: DC | PRN

## 2014-03-12 MED ORDER — MIDAZOLAM HCL 2 MG/2ML IJ SOLN
INTRAMUSCULAR | Status: AC
  Filled 2014-03-12: qty 2

## 2014-03-12 MED ORDER — LIDOCAINE HCL (CARDIAC) 20 MG/ML IV SOLN
INTRAVENOUS | Status: AC
  Filled 2014-03-12: qty 5

## 2014-03-12 MED ORDER — BUPIVACAINE HCL (PF) 0.25 % IJ SOLN
INTRAMUSCULAR | Status: AC
  Filled 2014-03-12: qty 30

## 2014-03-12 MED ORDER — ARTIFICIAL TEARS OP OINT
TOPICAL_OINTMENT | OPHTHALMIC | Status: AC
  Filled 2014-03-12: qty 3.5

## 2014-03-12 MED ORDER — SCOPOLAMINE 1 MG/3DAYS TD PT72
1.0000 | MEDICATED_PATCH | TRANSDERMAL | Status: DC
Start: 2014-03-12 — End: 2014-03-12

## 2014-03-12 MED ORDER — GLYCOPYRROLATE 0.2 MG/ML IJ SOLN
INTRAMUSCULAR | Status: DC | PRN
  Administered 2014-03-12: .6 mg via INTRAVENOUS

## 2014-03-12 MED ORDER — KETOROLAC TROMETHAMINE 30 MG/ML IJ SOLN
INTRAMUSCULAR | Status: AC
  Filled 2014-03-12: qty 1

## 2014-03-12 MED ORDER — KETOROLAC TROMETHAMINE 30 MG/ML IJ SOLN
INTRAMUSCULAR | Status: DC | PRN
  Administered 2014-03-12: 30 mg via INTRAVENOUS

## 2014-03-12 MED ORDER — PROPOFOL 10 MG/ML IV EMUL
INTRAVENOUS | Status: AC
  Filled 2014-03-12: qty 20

## 2014-03-12 MED ORDER — BUPIVACAINE HCL (PF) 0.25 % IJ SOLN
INTRAMUSCULAR | Status: DC | PRN
  Administered 2014-03-12: 5 mL

## 2014-03-12 MED ORDER — ONDANSETRON HCL 4 MG/2ML IJ SOLN: 4.0000 mg | Freq: Once | INTRAMUSCULAR | Status: DC | PRN

## 2014-03-12 MED ORDER — SODIUM CHLORIDE 0.9 % IJ SOLN
INTRAMUSCULAR | Status: AC
  Filled 2014-03-12: qty 10

## 2014-03-12 MED ORDER — GLYCOPYRROLATE 0.2 MG/ML IJ SOLN
INTRAMUSCULAR | Status: AC
  Filled 2014-03-12: qty 3

## 2014-03-12 MED ORDER — LABETALOL HCL 5 MG/ML IV SOLN
INTRAVENOUS | Status: AC
  Filled 2014-03-12: qty 4

## 2014-03-12 MED ORDER — HYDROCODONE-IBUPROFEN 7.5-200 MG PO TABS: 1.0000 | ORAL_TABLET | Freq: Three times a day (TID) | ORAL | Status: DC | PRN

## 2014-03-12 MED ORDER — ONDANSETRON HCL 4 MG/2ML IJ SOLN
INTRAMUSCULAR | Status: AC
  Filled 2014-03-12: qty 2

## 2014-03-12 MED ORDER — DEXAMETHASONE SODIUM PHOSPHATE 10 MG/ML IJ SOLN
INTRAMUSCULAR | Status: DC | PRN
  Administered 2014-03-12: 10 mg via INTRAVENOUS

## 2014-03-12 MED ORDER — FENTANYL CITRATE 0.05 MG/ML IJ SOLN
INTRAMUSCULAR | Status: AC
  Filled 2014-03-12: qty 5

## 2014-03-12 MED ORDER — DEXAMETHASONE SODIUM PHOSPHATE 10 MG/ML IJ SOLN
INTRAMUSCULAR | Status: AC
  Filled 2014-03-12: qty 1

## 2014-03-12 MED ORDER — FENTANYL CITRATE 0.05 MG/ML IJ SOLN
INTRAMUSCULAR | Status: AC
  Filled 2014-03-12: qty 2

## 2014-03-12 MED ORDER — METOCLOPRAMIDE HCL 5 MG/ML IJ SOLN
INTRAMUSCULAR | Status: AC
  Administered 2014-03-12: 10 mg via INTRAVENOUS
  Filled 2014-03-12: qty 2

## 2014-03-12 MED ORDER — LACTATED RINGERS IV SOLN
INTRAVENOUS | Status: DC
  Administered 2014-03-12 (×2): via INTRAVENOUS

## 2014-03-12 MED ORDER — NEOSTIGMINE METHYLSULFATE 1 MG/ML IJ SOLN
INTRAMUSCULAR | Status: DC | PRN
  Administered 2014-03-12: 3 mg via INTRAVENOUS

## 2014-03-12 MED ORDER — ROCURONIUM BROMIDE 100 MG/10ML IV SOLN
INTRAVENOUS | Status: DC | PRN
  Administered 2014-03-12: 30 mg via INTRAVENOUS

## 2014-03-12 MED ORDER — SCOPOLAMINE 1 MG/3DAYS TD PT72
MEDICATED_PATCH | TRANSDERMAL | Status: AC
  Administered 2014-03-12: 1.5 mg via TRANSDERMAL
  Filled 2014-03-12: qty 1

## 2014-03-12 MED ORDER — SODIUM CHLORIDE 0.9 % IJ SOLN
INTRAMUSCULAR | Status: DC | PRN
  Administered 2014-03-12: 10 mL

## 2014-03-12 MED ORDER — FENTANYL CITRATE 0.05 MG/ML IJ SOLN
INTRAMUSCULAR | Status: DC | PRN
  Administered 2014-03-12: 50 ug via INTRAVENOUS
  Administered 2014-03-12 (×2): 100 ug via INTRAVENOUS
  Administered 2014-03-12 (×2): 50 ug via INTRAVENOUS

## 2014-03-12 MED ORDER — ROCURONIUM BROMIDE 100 MG/10ML IV SOLN
INTRAVENOUS | Status: AC
  Filled 2014-03-12: qty 1

## 2014-03-12 MED ORDER — LIDOCAINE HCL (CARDIAC) 20 MG/ML IV SOLN
INTRAVENOUS | Status: DC | PRN
  Administered 2014-03-12: 80 mg via INTRAVENOUS

## 2014-03-12 MED ORDER — HYDROMORPHONE HCL PF 1 MG/ML IJ SOLN
INTRAMUSCULAR | Status: DC | PRN
  Administered 2014-03-12: 1 mg via INTRAVENOUS

## 2014-03-12 MED ORDER — ONDANSETRON HCL 4 MG/2ML IJ SOLN
INTRAMUSCULAR | Status: DC | PRN
  Administered 2014-03-12: 4 mg via INTRAVENOUS

## 2014-03-12 MED ORDER — HYDROMORPHONE HCL PF 1 MG/ML IJ SOLN
INTRAMUSCULAR | Status: AC
  Filled 2014-03-12: qty 1

## 2014-03-12 MED ORDER — SCOPOLAMINE 1 MG/3DAYS TD PT72
1.0000 | MEDICATED_PATCH | TRANSDERMAL | Status: DC
  Administered 2014-03-12: 1.5 mg via TRANSDERMAL

## 2014-03-12 MED ORDER — MEPERIDINE HCL 25 MG/ML IJ SOLN: 6.2500 mg | INTRAMUSCULAR | Status: DC | PRN

## 2014-03-12 MED ORDER — PROPOFOL 10 MG/ML IV BOLUS
INTRAVENOUS | Status: DC | PRN
  Administered 2014-03-12: 200 mg via INTRAVENOUS
  Administered 2014-03-12: 100 mg via INTRAVENOUS

## 2014-03-12 MED ORDER — METOCLOPRAMIDE HCL 5 MG/ML IJ SOLN
10.0000 mg | Freq: Once | INTRAMUSCULAR | Status: AC
  Administered 2014-03-12: 10 mg via INTRAVENOUS

## 2014-03-12 MED ORDER — GLYCOPYRROLATE 0.2 MG/ML IJ SOLN
INTRAMUSCULAR | Status: AC
  Filled 2014-03-12: qty 5

## 2014-03-12 MED ORDER — NEOSTIGMINE METHYLSULFATE 10 MG/10ML IV SOLN
INTRAVENOUS | Status: AC
  Filled 2014-03-12: qty 1

## 2014-03-12 MED ORDER — FENTANYL CITRATE 0.05 MG/ML IJ SOLN: 25.0000 ug | INTRAMUSCULAR | Status: DC | PRN

## 2014-03-12 SURGICAL SUPPLY — 18 items
CABLE HIGH FREQUENCY MONO STRZ (ELECTRODE) IMPLANT
CATH ROBINSON RED A/P 16FR (CATHETERS) ×2 IMPLANT
CLOTH BEACON ORANGE TIMEOUT ST (SAFETY) ×2 IMPLANT
DERMABOND ADVANCED (GAUZE/BANDAGES/DRESSINGS) ×1
DERMABOND ADVANCED .7 DNX12 (GAUZE/BANDAGES/DRESSINGS) ×1 IMPLANT
GLOVE BIO SURGEON STRL SZ7 (GLOVE) ×4 IMPLANT
GOWN STRL REUS W/TWL LRG LVL3 (GOWN DISPOSABLE) ×4 IMPLANT
NEEDLE INSUFFLATION 120MM (ENDOMECHANICALS) ×2 IMPLANT
NS IRRIG 1000ML POUR BTL (IV SOLUTION) ×2 IMPLANT
PACK LAPAROSCOPY BASIN (CUSTOM PROCEDURE TRAY) ×2 IMPLANT
PROTECTOR NERVE ULNAR (MISCELLANEOUS) ×2 IMPLANT
SET IRRIG TUBING LAPAROSCOPIC (IRRIGATION / IRRIGATOR) IMPLANT
SUT VICRYL RAPIDE 4/0 PS 2 (SUTURE) ×4 IMPLANT
TOWEL OR 17X24 6PK STRL BLUE (TOWEL DISPOSABLE) ×4 IMPLANT
TROCAR OPTI TIP 5M 100M (ENDOMECHANICALS) ×4 IMPLANT
TROCAR XCEL DIL TIP R 11M (ENDOMECHANICALS) ×2 IMPLANT
WARMER LAPAROSCOPE (MISCELLANEOUS) ×2 IMPLANT
WATER STERILE IRR 1000ML POUR (IV SOLUTION) ×2 IMPLANT

## 2014-03-12 NOTE — Transfer of Care (Signed)
Immediate Anesthesia Transfer of Care Note  Patient: [name]  Procedure(s) Performed: Procedure(s): LAPAROSCOPY DIAGNOSTIC (N/A)  Patient Location: PACU  Anesthesia Type:General  Level of Consciousness: awake, alert  and oriented  Airway & Oxygen Therapy: Patient Spontanous Breathing and Patient connected to nasal cannula oxygen  Post-op Assessment: Report given to PACU RN, Post -op Vital signs reviewed and stable and Patient moving all extremities  Post vital signs: Reviewed and stable  Complications: No apparent anesthesia complications

## 2014-03-12 NOTE — Progress Notes (Signed)
The patient was re-examined with no change in status 

## 2014-03-12 NOTE — Op Note (Signed)
Preoperative diagnosis: Pelvic pain  Postoperative diagnosis: Same, plus pelvic endometriosis  Procedure: Diagnostic laparoscopy  Surgeon: Matthew Saras  Anesthesia: Gen.  Complications: None  Drains: In and out catheter  Procedure and findings:  The patient taken the operating room after an adequate level of general anesthesia was obtained the patient's legs in stirrups the abdomen perineum and vagina prepped and draped in the usual fashion for laparoscopy. Appropriate timeout for taken at that point, the bladder was drained, EUA carried out, uterus small anterior, mobile adnexa negative, Conn cannula was positioned. Attention directed to the abdomen, where the periumbilical area was infiltrated with quarter percent Marcaine plain, a small incision was made and varies needle was introduced without difficulty. Its intra-abdominal position was verified by pressure water testing. After 2-1/2 L pneumoperitoneum syncopated, lap scopic trocar and sleeve were then introduced without difficulty. There was no evidence of any bleeding or trauma. Patient placed in slight Trendelenburg, 3 finger breaths above the symphysis in the midline a 5 mm trocar was inserted under direct visualization. With the patient in Trendelenburg the pelvic findings as follows:  The upper abdomen was unremarkable the cecum and appendix appeared normal in the pelvis, the uterus itself was normal size with normal serosa right and left adnexa unremarkable fimbriated ends of both tubes appeared to be normal there were some pelvic RAM sigmoid colon adhesions but this did not impact the left adnexa whatsoever. In the cul-de-sac were speckled areas of brownish and blackish endometriosis along the right uterosacral ligament and less well-defined in the cul-de-sac peritoneum. These areas for further documented. Coagulation was not carried out. Its was removed, gas allowed to escape, the defect closed with 4-0 Vicryl subcuticular suture and  Dermabond on the lower incision. She tolerated this well went to recovery room in good condition.  Dictated with [facility]  Jameisha Stofko M. Garry Heater.D.

## 2014-03-12 NOTE — Discharge Instructions (Signed)
DO NOT TAKE IBUPROFEN (ALEVE, ADVIL OR MOTRIN) TILL AFTER 2 PM!  Diagnostic Laparoscopy  Laparoscopy is a surgical procedure. It is used to diagnose and treat diseases inside the belly (abdomen). It is usually a brief, common, and relatively simple procedure. The laparoscopeis a thin, lighted, pencil-sized instrument. It is like a telescope. It is inserted into your abdomen through a small cut (incision). Your caregiver can look at the organs inside your body through this instrument. He or she can see if there is anything abnormal.  Laparoscopy can be done either in a hospital or outpatient clinic. You may be given a mild sedative to help you relax before the procedure. Once in the operating room, you will be given a drug to make you sleep (general anesthesia). Laparoscopy usually lasts less than 1 hour. After the procedure, you will be monitored in a recovery area until you are stable and doing well. Once you are home, it will take 2 to 3 days to fully recover.  RISKS AND COMPLICATIONS  Laparoscopy has relatively few risks. Your caregiver will discuss the risks with you before the procedure.  Some problems that can occur include:  Infection.  Bleeding.  Damage to other organs.  Anesthetic side effects. PROCEDURE  Once you receive anesthesia, your surgeon inflates the abdomen with a harmless gas (carbon dioxide). This makes the organs easier to see. The laparoscope is inserted into the abdomen through a small incision. This allows your surgeon to see into the abdomen. Other small instruments are also inserted into the abdomen through other small openings. Many surgeons attach a video camera to the laparoscope to enlarge the view.  During a diagnostic laparoscopy, the surgeon may be looking for inflammation, infection, or cancer. Your surgeon may take tissue samples(biopsies). The samples are sent to a specialist in looking at cells and tissue samples (pathologist). The pathologist examines them under a  microscope. Biopsies can help to diagnose or confirm a disease.  AFTER THE PROCEDURE  The gas is released from inside the abdomen.  The incisions are closed with stitches (sutures). Because these incisions are small (usually less than 1/2 inch), there is usually minimal discomfort after the procedure. There may be some mild discomfort in the throat. This is from the tube placed in the throat while you were sleeping. You may have some mild abdominal discomfort. There may also be discomfort from the instrument placement incisions in the abdomen.  The recovery time is shortened as long as there are no complications.  You will rest in a recovery room until stable and doing well. As long as there are no complications, you may be allowed to go home. FINDING OUT THE RESULTS OF YOUR TEST  Not all test results are available during your visit. If your test results are not back during the visit, make an appointment with your caregiver to find out the results. Do not assume everything is normal if you have not heard from your caregiver or the medical facility. It is important for you to follow up on all of your test results.  HOME CARE INSTRUCTIONS  Take all medicines as directed.  Only take over-the-counter or prescription medicines for pain, discomfort, or fever as directed by your caregiver.  Resume daily activities as directed.  Showers are preferred over baths.  You may resume sexual activities in 1 week or as directed.  Do not drive while taking narcotics. SEEK MEDICAL CARE IF:  There is increasing abdominal pain.  There is new pain in  the shoulders (shoulder strap areas).  You feel lightheaded or faint.  You have the chills.  You or your child has an oral temperature above 102 F (38.9 C).  There is pus-like (purulent) drainage from any of the wounds.  You are unable to pass gas or have a bowel movement.  You feel sick to your stomach (nauseous) or throw up (vomit). MAKE SURE YOU:  Understand  these instructions.  Will watch your condition.  Will get help right away if you are not doing well or get worse.

## 2014-03-12 NOTE — Anesthesia Postprocedure Evaluation (Signed)
Anesthesia Post Note  Patient: [name]  Procedure(s) Performed: Procedure(s) (LRB): LAPAROSCOPY DIAGNOSTIC (N/A)  Anesthesia type: General  Patient location: PACU  Post pain: Pain level controlled  Post assessment: Post-op Vital signs reviewed  Last Vitals:  Filed Vitals:   03/12/14 0915  BP: 105/53  Pulse: 91  Temp: 36.4 C  Resp: 19    Post vital signs: Reviewed  Level of consciousness: sedated  Complications: No apparent anesthesia complications

## 2014-03-12 NOTE — OR Nursing (Signed)
Patient declined Vicoprofen prescription stating it will make her sick. Stated she will prefer to take Ibuprofen 800 mg. Prescription not signed by physician.

## 2014-03-13 ENCOUNTER — Encounter (HOSPITAL_COMMUNITY): Payer: Self-pay | Admitting: Obstetrics and Gynecology

## 2014-09-22 ENCOUNTER — Other Ambulatory Visit: Payer: Self-pay | Admitting: Obstetrics and Gynecology

## 2014-09-23 LAB — CYTOLOGY - PAP

## 2014-09-28 ENCOUNTER — Other Ambulatory Visit: Payer: Self-pay | Admitting: Family Medicine

## 2014-09-28 DIAGNOSIS — G43809 Other migraine, not intractable, without status migrainosus: Secondary | ICD-10-CM

## 2014-10-05 ENCOUNTER — Inpatient Hospital Stay: Admission: RE | Admit: 2014-10-05 | Payer: BC Managed Care – PPO | Source: Ambulatory Visit

## 2014-10-14 ENCOUNTER — Other Ambulatory Visit: Payer: Self-pay | Admitting: Gastroenterology

## 2014-10-15 ENCOUNTER — Other Ambulatory Visit: Payer: BC Managed Care – PPO

## 2014-10-16 ENCOUNTER — Other Ambulatory Visit: Payer: Self-pay | Admitting: Gastroenterology

## 2014-10-23 ENCOUNTER — Ambulatory Visit
Admission: RE | Admit: 2014-10-23 | Discharge: 2014-10-23 | Disposition: A | Discharge status: Home / Self Care | Payer: BC Managed Care – PPO | Source: Ambulatory Visit | Attending: Family Medicine | Admitting: Family Medicine

## 2014-10-23 DIAGNOSIS — G43809 Other migraine, not intractable, without status migrainosus: Secondary | ICD-10-CM

## 2014-11-04 ENCOUNTER — Other Ambulatory Visit: Payer: Self-pay | Admitting: Gastroenterology

## 2015-08-26 ENCOUNTER — Other Ambulatory Visit: Payer: Self-pay | Admitting: Orthopedic Surgery

## 2015-09-01 ENCOUNTER — Encounter (HOSPITAL_BASED_OUTPATIENT_CLINIC_OR_DEPARTMENT_OTHER): Payer: Self-pay | Admitting: *Deleted

## 2015-09-08 ENCOUNTER — Ambulatory Visit (HOSPITAL_BASED_OUTPATIENT_CLINIC_OR_DEPARTMENT_OTHER): Payer: 59 | Admitting: Anesthesiology

## 2015-09-08 ENCOUNTER — Encounter (HOSPITAL_BASED_OUTPATIENT_CLINIC_OR_DEPARTMENT_OTHER): Payer: Self-pay | Admitting: *Deleted

## 2015-09-08 ENCOUNTER — Ambulatory Visit (HOSPITAL_BASED_OUTPATIENT_CLINIC_OR_DEPARTMENT_OTHER)
Admission: RE | Admit: 2015-09-08 | Discharge: 2015-09-08 | Disposition: A | Discharge status: Home / Self Care | Payer: 59 | Source: Ambulatory Visit | Attending: Orthopedic Surgery | Admitting: Orthopedic Surgery

## 2015-09-08 ENCOUNTER — Encounter (HOSPITAL_BASED_OUTPATIENT_CLINIC_OR_DEPARTMENT_OTHER)
Admission: RE | Disposition: A | Discharge status: Home / Self Care | Payer: Self-pay | Source: Ambulatory Visit | Attending: Orthopedic Surgery

## 2015-09-08 DIAGNOSIS — J45909 Unspecified asthma, uncomplicated: Secondary | ICD-10-CM | POA: Diagnosis not present

## 2015-09-08 DIAGNOSIS — K219 Gastro-esophageal reflux disease without esophagitis: Secondary | ICD-10-CM | POA: Diagnosis not present

## 2015-09-08 DIAGNOSIS — F17210 Nicotine dependence, cigarettes, uncomplicated: Secondary | ICD-10-CM | POA: Diagnosis not present

## 2015-09-08 DIAGNOSIS — M67432 Ganglion, left wrist: Secondary | ICD-10-CM | POA: Insufficient documentation

## 2015-09-08 DIAGNOSIS — R2232 Localized swelling, mass and lump, left upper limb: Secondary | ICD-10-CM | POA: Diagnosis present

## 2015-09-08 DIAGNOSIS — Z79899 Other long term (current) drug therapy: Secondary | ICD-10-CM | POA: Insufficient documentation

## 2015-09-08 DIAGNOSIS — F419 Anxiety disorder, unspecified: Secondary | ICD-10-CM | POA: Diagnosis not present

## 2015-09-08 DIAGNOSIS — Z7982 Long term (current) use of aspirin: Secondary | ICD-10-CM | POA: Diagnosis not present

## 2015-09-08 DIAGNOSIS — F429 Obsessive-compulsive disorder, unspecified: Secondary | ICD-10-CM | POA: Diagnosis not present

## 2015-09-08 HISTORY — DX: Obsessive-compulsive disorder, unspecified: F42.9

## 2015-09-08 HISTORY — PX: MASS EXCISION: SHX2000

## 2015-09-08 LAB — PREGNANCY, URINE: PREG TEST UR: NEGATIVE

## 2015-09-08 SURGERY — EXCISION MASS
Anesthesia: General | Site: Wrist | Laterality: Left

## 2015-09-08 MED ORDER — FENTANYL CITRATE (PF) 100 MCG/2ML IJ SOLN
INTRAMUSCULAR | Status: AC
  Filled 2015-09-08: qty 4

## 2015-09-08 MED ORDER — BUPIVACAINE HCL (PF) 0.25 % IJ SOLN
INTRAMUSCULAR | Status: DC | PRN
  Administered 2015-09-08: 9 mL

## 2015-09-08 MED ORDER — SCOPOLAMINE 1 MG/3DAYS TD PT72
1.0000 | MEDICATED_PATCH | Freq: Once | TRANSDERMAL | Status: DC | PRN
  Administered 2015-09-08: 1.5 mg via TRANSDERMAL

## 2015-09-08 MED ORDER — GLYCOPYRROLATE 0.2 MG/ML IJ SOLN: 0.2000 mg | Freq: Once | INTRAMUSCULAR | Status: DC | PRN

## 2015-09-08 MED ORDER — BUPIVACAINE HCL (PF) 0.25 % IJ SOLN
INTRAMUSCULAR | Status: AC
  Filled 2015-09-08: qty 90

## 2015-09-08 MED ORDER — CHLORHEXIDINE GLUCONATE 4 % EX LIQD: 60.0000 mL | Freq: Once | CUTANEOUS | Status: DC

## 2015-09-08 MED ORDER — ONDANSETRON HCL 4 MG/2ML IJ SOLN
INTRAMUSCULAR | Status: DC | PRN
  Administered 2015-09-08: 4 mg via INTRAVENOUS

## 2015-09-08 MED ORDER — ONDANSETRON HCL 4 MG/2ML IJ SOLN
INTRAMUSCULAR | Status: AC
  Filled 2015-09-08: qty 2

## 2015-09-08 MED ORDER — FENTANYL CITRATE (PF) 100 MCG/2ML IJ SOLN
INTRAMUSCULAR | Status: AC
  Filled 2015-09-08: qty 2

## 2015-09-08 MED ORDER — LIDOCAINE HCL (CARDIAC) 20 MG/ML IV SOLN
INTRAVENOUS | Status: DC | PRN
  Administered 2015-09-08: 50 mg via INTRAVENOUS

## 2015-09-08 MED ORDER — OXYCODONE HCL 5 MG/5ML PO SOLN: 5.0000 mg | Freq: Once | ORAL | Status: DC | PRN

## 2015-09-08 MED ORDER — DEXAMETHASONE SODIUM PHOSPHATE 10 MG/ML IJ SOLN
INTRAMUSCULAR | Status: AC
  Filled 2015-09-08: qty 1

## 2015-09-08 MED ORDER — FENTANYL CITRATE (PF) 100 MCG/2ML IJ SOLN
50.0000 ug | INTRAMUSCULAR | Status: DC | PRN
  Administered 2015-09-08: 25 ug via INTRAVENOUS
  Administered 2015-09-08: 50 ug via INTRAVENOUS

## 2015-09-08 MED ORDER — LIDOCAINE HCL (CARDIAC) 20 MG/ML IV SOLN
INTRAVENOUS | Status: AC
  Filled 2015-09-08: qty 5

## 2015-09-08 MED ORDER — PROMETHAZINE HCL 25 MG/ML IJ SOLN
INTRAMUSCULAR | Status: AC
  Filled 2015-09-08: qty 1

## 2015-09-08 MED ORDER — DEXAMETHASONE SODIUM PHOSPHATE 4 MG/ML IJ SOLN
INTRAMUSCULAR | Status: DC | PRN
  Administered 2015-09-08: 10 mg via INTRAVENOUS

## 2015-09-08 MED ORDER — OXYCODONE-ACETAMINOPHEN 5-325 MG PO TABS: 1.0000 | ORAL_TABLET | ORAL | Status: DC | PRN

## 2015-09-08 MED ORDER — OXYCODONE HCL 5 MG PO TABS: 5.0000 mg | ORAL_TABLET | Freq: Once | ORAL | Status: DC | PRN

## 2015-09-08 MED ORDER — MIDAZOLAM HCL 2 MG/2ML IJ SOLN
INTRAMUSCULAR | Status: AC
  Filled 2015-09-08: qty 4

## 2015-09-08 MED ORDER — SCOPOLAMINE 1 MG/3DAYS TD PT72
MEDICATED_PATCH | TRANSDERMAL | Status: AC
  Filled 2015-09-08: qty 1

## 2015-09-08 MED ORDER — KETOROLAC TROMETHAMINE 30 MG/ML IJ SOLN
30.0000 mg | Freq: Once | INTRAMUSCULAR | Status: DC | PRN
  Administered 2015-09-08: 30 mg via INTRAVENOUS

## 2015-09-08 MED ORDER — FENTANYL CITRATE (PF) 100 MCG/2ML IJ SOLN
25.0000 ug | INTRAMUSCULAR | Status: DC | PRN
  Administered 2015-09-08: 50 ug via INTRAVENOUS

## 2015-09-08 MED ORDER — CEFAZOLIN SODIUM-DEXTROSE 2-3 GM-% IV SOLR
INTRAVENOUS | Status: AC
  Filled 2015-09-08: qty 50

## 2015-09-08 MED ORDER — MIDAZOLAM HCL 2 MG/2ML IJ SOLN
1.0000 mg | INTRAMUSCULAR | Status: DC | PRN
Start: 2015-09-08 — End: 2015-09-08
  Administered 2015-09-08: 2 mg via INTRAVENOUS

## 2015-09-08 MED ORDER — MEPERIDINE HCL 25 MG/ML IJ SOLN: 6.2500 mg | INTRAMUSCULAR | Status: DC | PRN

## 2015-09-08 MED ORDER — PROPOFOL 500 MG/50ML IV EMUL
INTRAVENOUS | Status: AC
  Filled 2015-09-08: qty 50

## 2015-09-08 MED ORDER — SODIUM CHLORIDE 0.9 % IJ SOLN
INTRAMUSCULAR | Status: AC
  Filled 2015-09-08: qty 10

## 2015-09-08 MED ORDER — SUCCINYLCHOLINE CHLORIDE 20 MG/ML IJ SOLN
INTRAMUSCULAR | Status: AC
  Filled 2015-09-08: qty 1

## 2015-09-08 MED ORDER — BUPIVACAINE-EPINEPHRINE (PF) 0.25% -1:200000 IJ SOLN
INTRAMUSCULAR | Status: AC
  Filled 2015-09-08: qty 30

## 2015-09-08 MED ORDER — LACTATED RINGERS IV SOLN
INTRAVENOUS | Status: DC
  Administered 2015-09-08: 08:00:00 via INTRAVENOUS
  Administered 2015-09-08: 10 mL/h via INTRAVENOUS

## 2015-09-08 MED ORDER — CEFAZOLIN SODIUM-DEXTROSE 2-3 GM-% IV SOLR
2.0000 g | INTRAVENOUS | Status: AC
  Administered 2015-09-08: 2 g via INTRAVENOUS

## 2015-09-08 MED ORDER — KETOROLAC TROMETHAMINE 30 MG/ML IJ SOLN
INTRAMUSCULAR | Status: AC
  Filled 2015-09-08: qty 1

## 2015-09-08 MED ORDER — PROPOFOL 10 MG/ML IV BOLUS
INTRAVENOUS | Status: DC | PRN
  Administered 2015-09-08: 140 mg via INTRAVENOUS

## 2015-09-08 MED ORDER — PROMETHAZINE HCL 25 MG/ML IJ SOLN
6.2500 mg | INTRAMUSCULAR | Status: DC | PRN
  Administered 2015-09-08: 6.25 mg via INTRAVENOUS

## 2015-09-08 SURGICAL SUPPLY — 46 items
APL SKNCLS STERI-STRIP NONHPOA (GAUZE/BANDAGES/DRESSINGS) ×1
BAG DECANTER FOR FLEXI CONT (MISCELLANEOUS) IMPLANT
BANDAGE ELASTIC 3 VELCRO ST LF (GAUZE/BANDAGES/DRESSINGS) IMPLANT
BANDAGE ELASTIC 4 VELCRO ST LF (GAUZE/BANDAGES/DRESSINGS) ×2 IMPLANT
BENZOIN TINCTURE PRP APPL 2/3 (GAUZE/BANDAGES/DRESSINGS) ×2 IMPLANT
BLADE SURG 15 STRL LF DISP TIS (BLADE) ×1 IMPLANT
BLADE SURG 15 STRL SS (BLADE) ×2
BNDG CMPR 9X4 STRL LF SNTH (GAUZE/BANDAGES/DRESSINGS)
BNDG ESMARK 4X9 LF (GAUZE/BANDAGES/DRESSINGS) IMPLANT
BNDG GAUZE ELAST 4 BULKY (GAUZE/BANDAGES/DRESSINGS) ×2 IMPLANT
CORDS BIPOLAR (ELECTRODE) ×2 IMPLANT
COVER BACK TABLE 60X90IN (DRAPES) ×2 IMPLANT
CUFF TOURNIQUET SINGLE 18IN (TOURNIQUET CUFF) ×2 IMPLANT
DECANTER SPIKE VIAL GLASS SM (MISCELLANEOUS) IMPLANT
DRAPE EXTREMITY T 121X128X90 (DRAPE) ×2 IMPLANT
DRAPE SURG 17X23 STRL (DRAPES) ×2 IMPLANT
DURAPREP 26ML APPLICATOR (WOUND CARE) ×2 IMPLANT
GAUZE SPONGE 4X4 12PLY STRL (GAUZE/BANDAGES/DRESSINGS) ×2 IMPLANT
GAUZE XEROFORM 1X8 LF (GAUZE/BANDAGES/DRESSINGS) IMPLANT
GLOVE BIO SURGEON STRL SZ 6.5 (GLOVE) ×2 IMPLANT
GLOVE BIOGEL PI IND STRL 7.0 (GLOVE) ×1 IMPLANT
GLOVE BIOGEL PI INDICATOR 7.0 (GLOVE) ×1
GLOVE SURG SYN 8.0 (GLOVE) ×4 IMPLANT
GOWN STRL REUS W/ TWL LRG LVL3 (GOWN DISPOSABLE) ×1 IMPLANT
GOWN STRL REUS W/TWL LRG LVL3 (GOWN DISPOSABLE) ×2
GOWN STRL REUS W/TWL XL LVL3 (GOWN DISPOSABLE) ×2 IMPLANT
NEEDLE HYPO 25X1 1.5 SAFETY (NEEDLE) ×2 IMPLANT
NS IRRIG 1000ML POUR BTL (IV SOLUTION) ×2 IMPLANT
PACK BASIN DAY SURGERY FS (CUSTOM PROCEDURE TRAY) ×2 IMPLANT
PAD CAST 3X4 CTTN HI CHSV (CAST SUPPLIES) ×1 IMPLANT
PADDING CAST COTTON 3X4 STRL (CAST SUPPLIES) ×2
SHEET MEDIUM DRAPE 40X70 STRL (DRAPES) ×2 IMPLANT
SPLINT PLASTER CAST XFAST 4X15 (CAST SUPPLIES) ×5 IMPLANT
SPLINT PLASTER XTRA FAST SET 4 (CAST SUPPLIES) ×5
STOCKINETTE 4X48 STRL (DRAPES) ×2 IMPLANT
STRIP CLOSURE SKIN 1/2X4 (GAUZE/BANDAGES/DRESSINGS) ×2 IMPLANT
SUT ETHILON 5 0 PS 2 18 (SUTURE) IMPLANT
SUT PROLENE 3 0 PS 2 (SUTURE) ×2 IMPLANT
SUT VIC AB 4-0 P-3 18XBRD (SUTURE) IMPLANT
SUT VIC AB 4-0 P3 18 (SUTURE)
SUT VICRYL RAPIDE 4-0 (SUTURE) IMPLANT
SUT VICRYL RAPIDE 4/0 PS 2 (SUTURE) IMPLANT
SYR BULB 3OZ (MISCELLANEOUS) ×2 IMPLANT
SYRINGE 10CC LL (SYRINGE) ×2 IMPLANT
TOWEL OR 17X24 6PK STRL BLUE (TOWEL DISPOSABLE) ×2 IMPLANT
UNDERPAD 30X30 (UNDERPADS AND DIAPERS) ×2 IMPLANT

## 2015-09-08 NOTE — Anesthesia Procedure Notes (Signed)
Procedure Name: LMA Insertion Date/Time: 09/08/2015 8:36 AM Performed by: Lyndee Leo Pre-anesthesia Checklist: Patient identified, Emergency Drugs available, Suction available and Patient being monitored Patient Re-evaluated:Patient Re-evaluated prior to inductionOxygen Delivery Method: Circle System Utilized Preoxygenation: Pre-oxygenation with 100% oxygen Intubation Type: IV induction Ventilation: Mask ventilation without difficulty LMA: LMA inserted LMA Size: 3.0 Number of attempts: 1 Airway Equipment and Method: Bite block Placement Confirmation: positive ETCO2 Tube secured with: Tape Dental Injury: Teeth and Oropharynx as per pre-operative assessment

## 2015-09-08 NOTE — Anesthesia Postprocedure Evaluation (Signed)
Anesthesia Post Note  Patient: [name]  Procedure(s) Performed: Procedure(s) (LRB): EXCISION OF LEFT WRIST  MASS (Left)  Anesthesia type: General  Patient location: PACU  Post pain: Pain level controlled  Post assessment: Post-op Vital signs reviewed  Last Vitals: BP 110/77 mmHg  Pulse 88  Temp(Src) 36.9 C (Oral)  Resp 18  Ht 5\' 2"  (1.575 m)  Wt 114 lb (51.71 kg)  BMI 20.85 kg/m2  SpO2 98%  LMP   Post vital signs: Reviewed  Level of consciousness: sedated  Complications: No apparent anesthesia complications

## 2015-09-08 NOTE — Anesthesia Preprocedure Evaluation (Addendum)
Anesthesia Evaluation  Patient identified by MRN, date of birth, ID band Patient awake    Reviewed: Allergy & Precautions, NPO status , Patient's Chart, lab work & pertinent test results  Airway Mallampati: II  TM Distance: >3 FB Neck ROM: Full    Dental no notable dental hx.    Pulmonary asthma , Current Smoker,    Pulmonary exam normal breath sounds clear to auscultation       Cardiovascular Normal cardiovascular exam+ dysrhythmias  Rhythm:Regular Rate:Normal     Neuro/Psych  Headaches, PSYCHIATRIC DISORDERS Anxiety    GI/Hepatic Neg liver ROS, GERD  ,  Endo/Other  negative endocrine ROS  Renal/GU Renal disease     Musculoskeletal   Abdominal   Peds  Hematology negative hematology ROS (+)   Anesthesia Other Findings   Reproductive/Obstetrics                            Anesthesia Physical Anesthesia Plan  ASA: II  Anesthesia Plan: General   Post-op Pain Management:    Induction: Intravenous  Airway Management Planned: LMA  Additional Equipment:   Intra-op Plan:   Post-operative Plan: Extubation in OR  Informed Consent: I have reviewed the patients History and Physical, chart, labs and discussed the procedure including the risks, benefits and alternatives for the proposed anesthesia with the patient or authorized representative who has indicated his/her understanding and acceptance.   Dental advisory given  Plan Discussed with: CRNA  Anesthesia Plan Comments:        Anesthesia Quick Evaluation                                  Anesthesia Evaluation  Patient identified by MRN, date of birth, ID band Patient awake  General Assessment Comment:.  Headache(784.0)     .  Asthma     .  Ureteral calculi         BILATERAL   .  History of syncope         2011--  W/ PALPITATIONS  --  ECHO NORMAL   .  Anxiety         panic attacks   .  Dysrhythmia         pt states she's  scheduled to waer halter monitor in July d/t "skipped beats"   .  GERD (gastroesophageal reflux disease)         hx of " stomach ulcers"        Reviewed: Allergy & Precautions, H&P , NPO status , Patient's Chart, lab work & pertinent test results  Airway Mallampati: II TM Distance: >3 FB Neck ROM: Full    Dental no notable dental hx.    Pulmonary asthma , Current Smoker,  breath sounds clear to auscultation  Pulmonary exam normal       Cardiovascular Exercise Tolerance: Good + dysrhythmias Rhythm:Regular Rate:Normal  H/O syncope with Palpitations. Dr. Jacalyn Lefevre note from January 2012 reviewed. Echo normal.  She has undergone many holter evaluations without significant finding.   Neuro/Psych  Headaches, Anxiety    GI/Hepatic Neg liver ROS, GERD-  Medicated,  Endo/Other  negative endocrine ROS  Renal/GU Renal disease  negative genitourinary   Musculoskeletal negative musculoskeletal ROS (+)   Abdominal   Peds negative pediatric ROS (+)  Hematology negative hematology ROS (+)   Anesthesia Other Findings   Reproductive/Obstetrics Negative pregnancy test 05-07-13.  Anesthesia Physical  Anesthesia Plan  ASA: II  Anesthesia Plan: General   Post-op Pain Management:    Induction: Intravenous  Airway Management Planned: Oral ETT  Additional Equipment:   Intra-op Plan:   Post-operative Plan: Extubation in OR  Informed Consent: I have reviewed the patients History and Physical, chart, labs and discussed the procedure including the risks, benefits and alternatives for the proposed anesthesia with the patient or authorized representative who has indicated his/her understanding and acceptance.   Dental advisory given  Plan Discussed with: CRNA and Surgeon  Anesthesia Plan Comments:        Anesthesia Quick Evaluation

## 2015-09-08 NOTE — Discharge Instructions (Signed)

## 2015-09-08 NOTE — Transfer of Care (Signed)
Immediate Anesthesia Transfer of Care Note  Patient: [name]  Procedure(s) Performed: Procedure(s): EXCISION OF LEFT WRIST  MASS (Left)  Patient Location: PACU  Anesthesia Type:General  Level of Consciousness: awake, patient cooperative and confused  Airway & Oxygen Therapy: Patient Spontanous Breathing and Patient connected to face mask oxygen  Post-op Assessment: Report given to RN and Post -op Vital signs reviewed and stable  Post vital signs: Reviewed and stable  Last Vitals:  Filed Vitals:   09/08/15 0722  BP: 110/73  Pulse: 76  Temp: 36.9 C  Resp: 20    Complications: No apparent anesthesia complications

## 2015-09-08 NOTE — H&P (Signed)
[name] is an 29 y.o. female.   Chief Complaint: left wrist dorsal mass with pain HPI: as above with /o painful left wrist dorsal mass  Past Medical History  Diagnosis Date  . Asthma   . Ureteral calculi     BILATERAL  . History of syncope     2011--  W/ PALPITATIONS  --  ECHO NORMAL  . Anxiety     panic attacks  . Dysrhythmia     pt states she's scheduled to waer halter monitor in July d/t "skipped beats"  . GERD (gastroesophageal reflux disease)     hx of " stomach ulcers"  . H. pylori infection     in middle school  . Headache(784.0)     migraines  . OCD (obsessive compulsive disorder)     Past Surgical History  Procedure Laterality Date  . Cervix lesion destruction    . Wisdom tooth extraction    . Tympanostomy tube placement    . Tonsillectomy and adenoidectomy    . Left ureterscopic stone extraction  07-05-2006  . Extracorporeal shock wave lithotripsy    . Transthoracic echocardiogram  10-11-2010    NORMAL LVF/ EF 55-65%/ NORMAL VALVE'S  . Cystoscopy with retrograde pyelogram, ureteroscopy and stent placement Bilateral 05/07/2013    Procedure: CYSTOSCOPY WITH RETROGRADE PYELOGRAM, URETEROSCOPY,  BASKET STONE EXTRACTIONS AND STENT PLACEMENT, BILATERAL;  Surgeon: Kathleen Frock, MD;  Location[facility];  Service: Urology;  Laterality: Bilateral;  . Cystoscopy w/ ureteral stent removal Bilateral 05/22/2013    Procedure: CYSTOSCOPY WITH STENT REMOVAL;  Surgeon: Kathleen Frock, MD;  Location: [facility];  Service: Urology;  Laterality: Bilateral;  cysto and bilateral stent pull  . Dilation and evacuation N/A 07/10/2013    Procedure: DILATATION AND EVACUATION;  Surgeon: Kathleen Asal, MD;  Location: [facility];  Service: Gynecology;  Laterality: N/A;  . Laparoscopy N/A 03/12/2014    Procedure: LAPAROSCOPY DIAGNOSTIC;  Surgeon: Kathleen Asal, MD;  Location: [facility];  Service: Gynecology;  Laterality: N/A;    Family History  Problem  Relation Age of Onset  . Diabetes Mother   . Arthritis Mother   . Hypertension Mother   . Heart disease Mother   . Hyperlipidemia Mother   . Cancer Father   . Arthritis Father   . Cancer Maternal Aunt   . Leukemia Maternal Aunt   . Hyperlipidemia Maternal Aunt   . Cancer Maternal Uncle   . Hyperlipidemia Maternal Uncle   . Cancer Paternal Aunt   . Arthritis Maternal Grandmother   . Hyperlipidemia Maternal Grandmother   . Heart disease Maternal Grandfather   . Hyperlipidemia Maternal Grandfather   . Cancer Paternal Grandfather   . Colon cancer Neg Hx   . Stomach cancer Neg Hx    Social History:  reports that she has been smoking Cigarettes.  She has a 3 pack-year smoking history. She has never used smokeless tobacco. She reports that she does not drink alcohol or use illicit drugs.  Allergies:  Allergies  Allergen Reactions  . Dilaudid [Hydromorphone Hcl] Nausea And Vomiting  . Xanax [Alprazolam]   . Iodine Rash    Medications Prior to Admission  Medication Sig Dispense Refill  . aspirin 81 MG tablet Take 81 mg by mouth daily.    . cetirizine (ZYRTEC) 10 MG tablet Take 10 mg by mouth daily.    . diazepam (VALIUM) 5 MG tablet Take 5 mg by mouth every 6 (six) hours as needed for  anxiety.    . norethindrone-ethinyl estradiol-iron (MICROGESTIN FE,GILDESS FE,LOESTRIN FE) 1.5-30 MG-MCG tablet Take 1 tablet by mouth daily.    . pantoprazole (PROTONIX) 40 MG tablet TAKE 1 TABLET BY MOUTH TWICE DAILY 60 tablet 0  . promethazine (PHENERGAN) 25 MG tablet Take 1 tablet (25 mg total) by mouth every 6 (six) hours as needed for nausea. 30 tablet 0    Results for orders placed or performed during the hospital encounter of 09/08/15 (from the past 48 hour(s))  Pregnancy, urine     Status: None   Collection Time: 09/08/15  7:20 AM  Result Value Ref Range   Preg Test, Ur NEGATIVE NEGATIVE    Comment:        THE SENSITIVITY OF THIS METHODOLOGY IS >20 mIU/mL.    No results  found.  Review of Systems  All other systems reviewed and are negative.   Blood pressure 110/73, pulse 76, temperature 98.5 F (36.9 C), temperature source Oral, resp. rate 20, height 5\' 2"  (1.575 m), weight 51.71 kg (114 lb), SpO2 100 %. Physical Exam  Constitutional: She is oriented to person, place, and time. She appears well-developed and well-nourished.  HENT:  Head: Normocephalic and atraumatic.  Cardiovascular: Normal rate.   Respiratory: Effort normal.  Musculoskeletal:       Left wrist: She exhibits tenderness and swelling.  Left wrist painful dorsal mass  Neurological: She is alert and oriented to person, place, and time.  Skin: Skin is warm.  Psychiatric: She has a normal mood and affect. Her behavior is normal. Judgment and thought content normal.     Assessment/Plan As above  Plan excision  [name] A 09/08/2015, 8:25 AM

## 2015-09-08 NOTE — Op Note (Signed)
See note [phone]

## 2015-09-09 ENCOUNTER — Encounter (HOSPITAL_BASED_OUTPATIENT_CLINIC_OR_DEPARTMENT_OTHER): Payer: Self-pay | Admitting: Orthopedic Surgery

## 2015-09-09 NOTE — Op Note (Signed)
NAMEMICHIKO, [name]              ACCOUNT NO.:  [id number]  MEDICAL RECORD NO.:  [id number]  LOCATION:                               FACILITY:  [facility]  PHYSICIAN:  Sheral Apley. Lakendrick Paradis, M.D.DATE OF BIRTH:  1986-05-17  DATE OF PROCEDURE:  09/08/2015 DATE OF DISCHARGE:  09/08/2015                              OPERATIVE REPORT   PREOPERATIVE DIAGNOSIS:  Left wrist dorsal mass.  POSTOPERATIVE DIAGNOSIS:  Left wrist dorsal mass.  PROCEDURE:  Excision of left wrist deep dorsal mass.  SURGEON:  Sheral Apley. Burney Gauze, M.D.  ASSISTANT:  None.  ANESTHESIA:  General.  TOURNIQUET TIME:  14 minutes.  COMPLICATIONS:  No complications.  DRAINS:  No drains.  SPECIMENS:  No specimens sent.  DESCRIPTION OF PROCEDURE:  The patient was taken to the operating suite. After the induction of adequate general anesthetic, left upper extremity was prepped and draped in usual sterile fashion.  An Esmarch was used to exsanguinate the limb.  Tourniquet was inflated to 250 mmHg.  At this point in time, an incision was made over the dorsal aspect of the left wrist centered over Lister's tubercle area.  A 2 x 2 cm dorsal mass was premarked out.  We incised directly over the middle of the skin, incised sharply.  Dissection was carried down the interval between the second, third, and fourth dorsal compartments.  The cystic lesion was seen coming between the second and fourth dorsal compartments.  We carefully incised the sheath of the EPL, retracted radially.  We also opened the sheath of the second dorsal compartment and retracted it radially.  We then did a small incision over the fourth dorsal compartment and retracted the extensor tendons to the ulnar side.  We carefully identified and preserved branches of the superficial radial nerve and cephalic vein.  We dissected this lesion down to its stalk.  The stalk was carefully identified and then was excised.  We used bipolar cautery to cauterize the stalk  area.  It was a small area.  We looked into the joint to reveal no damage to the scapholunate area.  The wound was then thoroughly irrigated and loosely closed with a 3-0 Prolene subcuticular stitch.  Steri-Strips, 4x4s, fluffs, and a volar splint was applied.  The patient tolerated the procedure well and went to the recovery room in a stable fashion.     Sheral Apley Burney Gauze, M.D.     MAW/MEDQ  D:  09/08/2015  T:  09/09/2015  Job:  [id number]

## 2015-10-19 DIAGNOSIS — M67432 Ganglion, left wrist: Secondary | ICD-10-CM | POA: Insufficient documentation

## 2016-01-06 ENCOUNTER — Other Ambulatory Visit: Payer: Self-pay | Admitting: Gastroenterology

## 2016-07-10 LAB — OB RESULTS CONSOLE RPR: RPR: NONREACTIVE

## 2016-07-10 LAB — OB RESULTS CONSOLE ABO/RH: RH Type: NEGATIVE

## 2016-07-10 LAB — OB RESULTS CONSOLE ANTIBODY SCREEN: Antibody Screen: NEGATIVE

## 2016-07-10 LAB — OB RESULTS CONSOLE RUBELLA ANTIBODY, IGM: RUBELLA: IMMUNE

## 2016-07-10 LAB — OB RESULTS CONSOLE GC/CHLAMYDIA
CHLAMYDIA, DNA PROBE: NEGATIVE
GC PROBE AMP, GENITAL: NEGATIVE

## 2016-07-10 LAB — OB RESULTS CONSOLE HEPATITIS B SURFACE ANTIGEN: HEP B S AG: NEGATIVE

## 2016-07-10 LAB — OB RESULTS CONSOLE HIV ANTIBODY (ROUTINE TESTING): HIV: NONREACTIVE

## 2016-11-18 ENCOUNTER — Encounter (HOSPITAL_COMMUNITY): Payer: Self-pay | Admitting: *Deleted

## 2016-11-18 ENCOUNTER — Inpatient Hospital Stay (HOSPITAL_COMMUNITY): Payer: 59

## 2016-11-18 ENCOUNTER — Inpatient Hospital Stay (HOSPITAL_COMMUNITY)
Admission: AD | Admit: 2016-11-18 | Discharge: 2016-11-19 | Disposition: A | Discharge status: Home / Self Care | Payer: 59 | Source: Ambulatory Visit | Attending: Obstetrics and Gynecology | Admitting: Obstetrics and Gynecology

## 2016-11-18 DIAGNOSIS — R109 Unspecified abdominal pain: Secondary | ICD-10-CM | POA: Insufficient documentation

## 2016-11-18 DIAGNOSIS — Z7982 Long term (current) use of aspirin: Secondary | ICD-10-CM | POA: Insufficient documentation

## 2016-11-18 DIAGNOSIS — Z87891 Personal history of nicotine dependence: Secondary | ICD-10-CM | POA: Insufficient documentation

## 2016-11-18 DIAGNOSIS — Z3A27 27 weeks gestation of pregnancy: Secondary | ICD-10-CM | POA: Insufficient documentation

## 2016-11-18 DIAGNOSIS — Z79899 Other long term (current) drug therapy: Secondary | ICD-10-CM | POA: Insufficient documentation

## 2016-11-18 DIAGNOSIS — O26892 Other specified pregnancy related conditions, second trimester: Secondary | ICD-10-CM | POA: Diagnosis not present

## 2016-11-18 LAB — URINALYSIS, ROUTINE W REFLEX MICROSCOPIC
Bilirubin Urine: NEGATIVE
Glucose, UA: NEGATIVE mg/dL
Ketones, ur: NEGATIVE mg/dL
Leukocytes, UA: NEGATIVE
NITRITE: NEGATIVE
PROTEIN: NEGATIVE mg/dL
SPECIFIC GRAVITY, URINE: 1.008 (ref 1.005–1.030)
pH: 6 (ref 5.0–8.0)

## 2016-11-18 MED ORDER — ACETAMINOPHEN 160 MG/5ML PO SOLN
1000.0000 mg | Freq: Once | ORAL | Status: AC
  Administered 2016-11-18: 1000 mg via ORAL
  Filled 2016-11-18: qty 40

## 2016-11-18 NOTE — MAU Note (Signed)
Cramping since 1500 after getting off work.  Denies LOF or bleeding.

## 2016-11-18 NOTE — MAU Provider Note (Signed)
History     CSN: [id number]  Arrival date and time: 11/18/16 2216   First Provider Initiated Contact with Patient 11/18/16 2321      Chief Complaint  Patient presents with  . Abdominal Cramping   HPI   Ms.[name] is a 31 y.o. female G2P0010 _0  here in [place] with abdominal cramping. She has a significant history of kidney stones. She does not feel the pain is similar to that of when she had "stones". The pain started today around 3 pm which is when she left work.  The pain is located in her lower abdomen, the pain comes and goes. She has not tried anything for the pain.  She feels that she is feeling contractions 4-6 times per hour.   She denies vaginal bleeding + vaginal bleeding   OB History    Gravida Para Term Preterm AB Living   2       1     SAB TAB Ectopic Multiple Live Births   1              Past Medical History:  Diagnosis Date  . Anxiety    panic attacks  . Asthma   . Dysrhythmia    pt states she's scheduled to waer halter monitor in July d/t "skipped beats"  . GERD (gastroesophageal reflux disease)    hx of " stomach ulcers"  . H. pylori infection    in middle school  . Headache(784.0)    migraines  . History of syncope    2011--  W/ PALPITATIONS  --  ECHO NORMAL  . OCD (obsessive compulsive disorder)   . Ureteral calculi    BILATERAL    Past Surgical History:  Procedure Laterality Date  . CERVIX LESION DESTRUCTION    . CYSTOSCOPY W/ URETERAL STENT REMOVAL Bilateral 05/22/2013   Procedure: CYSTOSCOPY WITH STENT REMOVAL;  Surgeon: Alexis Frock, MD;  Location: [facility];  Service: Urology;  Laterality: Bilateral;  cysto and bilateral stent pull  . CYSTOSCOPY WITH RETROGRADE PYELOGRAM, URETEROSCOPY AND STENT PLACEMENT Bilateral 05/07/2013   Procedure: CYSTOSCOPY WITH RETROGRADE PYELOGRAM, URETEROSCOPY,  BASKET STONE EXTRACTIONS AND STENT PLACEMENT, BILATERAL;  Surgeon: Alexis Frock, MD;  Location: [facility];   Service: Urology;  Laterality: Bilateral;  . DILATION AND EVACUATION N/A 07/10/2013   Procedure: DILATATION AND EVACUATION;  Surgeon: Margarette Asal, MD;  Location: [facility];  Service: Gynecology;  Laterality: N/A;  . EXTRACORPOREAL SHOCK WAVE LITHOTRIPSY    . LAPAROSCOPY N/A 03/12/2014   Procedure: LAPAROSCOPY DIAGNOSTIC;  Surgeon: Margarette Asal, MD;  Location: [facility];  Service: Gynecology;  Laterality: N/A;  . LEFT URETERSCOPIC STONE EXTRACTION  07-05-2006  . MASS EXCISION Left 09/08/2015   Procedure: EXCISION OF LEFT WRIST  MASS;  Surgeon: Charlotte Crumb, MD;  Location: [place] [facility];  Service: Orthopedics;  Laterality: Left;  . TONSILLECTOMY AND ADENOIDECTOMY    . TRANSTHORACIC ECHOCARDIOGRAM  10-11-2010   NORMAL LVF/ EF 55-65%/ NORMAL VALVE'S  . TYMPANOSTOMY TUBE PLACEMENT    . WISDOM TOOTH EXTRACTION      Family History  Problem Relation Age of Onset  . Diabetes Mother   . Arthritis Mother   . Hypertension Mother   . Heart disease Mother   . Hyperlipidemia Mother   . Cancer Father   . Arthritis Father   . Cancer Maternal Aunt   . Leukemia Maternal Aunt   . Hyperlipidemia Maternal Aunt   . Cancer Maternal Uncle   .  Hyperlipidemia Maternal Uncle   . Cancer Paternal Aunt   . Arthritis Maternal Grandmother   . Hyperlipidemia Maternal Grandmother   . Heart disease Maternal Grandfather   . Hyperlipidemia Maternal Grandfather   . Cancer Paternal Grandfather   . Colon cancer Neg Hx   . Stomach cancer Neg Hx     Social History  Substance Use Topics  . Smoking status: Former Smoker    Packs/day: 0.50    Years: 6.00    Types: Cigarettes  . Smokeless tobacco: Never Used  . Alcohol use No    Allergies:  Allergies  Allergen Reactions  . Dilaudid [Hydromorphone Hcl] Nausea And Vomiting  . Xanax [Alprazolam]   . Iodine Rash    Prescriptions Prior to Admission  Medication Sig Dispense Refill Last Dose  . aspirin 81 MG tablet Take 81 mg by mouth  daily.   Past Week at Unknown time  . cetirizine (ZYRTEC) 10 MG tablet Take 10 mg by mouth daily.   09/07/2015 at Unknown time  . diazepam (VALIUM) 5 MG tablet Take 5 mg by mouth every 6 (six) hours as needed for anxiety.   Past Month at Unknown time  . norethindrone-ethinyl estradiol-iron (MICROGESTIN FE,GILDESS FE,LOESTRIN FE) 1.5-30 MG-MCG tablet Take 1 tablet by mouth daily.   09/07/2015 at Unknown time  . oxyCODONE-acetaminophen (ROXICET) 5-325 MG tablet Take 1 tablet by mouth every 4 (four) hours as needed for severe pain. 30 tablet 0   . pantoprazole (PROTONIX) 40 MG tablet TAKE 1 TABLET BY MOUTH TWICE DAILY 60 tablet 0 09/07/2015 at Unknown time  . promethazine (PHENERGAN) 25 MG tablet Take 1 tablet (25 mg total) by mouth every 6 (six) hours as needed for nausea. 30 tablet 0 09/07/2015 at Unknown time   Results for orders placed or performed during the hospital encounter of 11/18/16 (from the past 48 hour(s))  Urinalysis, Routine w reflex microscopic     Status: Abnormal   Collection Time: 11/18/16 10:40 PM  Result Value Ref Range   Color, Urine STRAW (A) YELLOW   APPearance CLEAR CLEAR   Specific Gravity, Urine 1.008 1.005 - 1.030   pH 6.0 5.0 - 8.0   Glucose, UA NEGATIVE NEGATIVE mg/dL   Hgb urine dipstick SMALL (A) NEGATIVE   Bilirubin Urine NEGATIVE NEGATIVE   Ketones, ur NEGATIVE NEGATIVE mg/dL   Protein, ur NEGATIVE NEGATIVE mg/dL   Nitrite NEGATIVE NEGATIVE   Leukocytes, UA NEGATIVE NEGATIVE   RBC / HPF 0-5 0 - 5 RBC/hpf   WBC, UA 0-5 0 - 5 WBC/hpf   Bacteria, UA FEW (A) NONE SEEN   Squamous Epithelial / LPF 0-5 (A) NONE SEEN   Mucous PRESENT   Comprehensive metabolic panel     Status: Abnormal   Collection Time: 11/18/16 11:52 PM  Result Value Ref Range   Sodium 137 135 - 145 mmol/L   Potassium 3.6 3.5 - 5.1 mmol/L   Chloride 106 101 - 111 mmol/L   CO2 23 22 - 32 mmol/L   Glucose, Bld 94 65 - 99 mg/dL   BUN 8 6 - 20 mg/dL   Creatinine, Ser 0.44 0.44 - 1.00 mg/dL    Calcium 8.8 (L) 8.9 - 10.3 mg/dL   Total Protein 6.4 (L) 6.5 - 8.1 g/dL   Albumin 3.2 (L) 3.5 - 5.0 g/dL   AST 18 15 - 41 U/L   ALT 13 (L) 14 - 54 U/L   Alkaline Phosphatase 41 38 - 126 U/L   Total Bilirubin 0.4  0.3 - 1.2 mg/dL   GFR calc non Af Amer >60 >60 mL/min   GFR calc Af Amer >60 >60 mL/min    Comment: (NOTE) The eGFR has been calculated using the CKD EPI equation. This calculation has not been validated in all clinical situations. eGFR's persistently <60 mL/min signify possible Chronic Kidney Disease.    Anion gap 8 5 - 15  CBC with Differential     Status: Abnormal   Collection Time: 11/18/16 11:52 PM  Result Value Ref Range   WBC 11.5 (H) 4.0 - 10.5 K/uL   RBC 3.46 (L) 3.87 - 5.11 MIL/uL   Hemoglobin 10.6 (L) 12.0 - 15.0 g/dL   HCT 31.0 (L) 36.0 - 46.0 %   MCV 89.6 78.0 - 100.0 fL   MCH 30.6 26.0 - 34.0 pg   MCHC 34.2 30.0 - 36.0 g/dL   RDW 13.7 11.5 - 15.5 %   Platelets 238 150 - 400 K/uL   Neutrophils Relative % 73 %   Neutro Abs 8.3 (H) 1.7 - 7.7 K/uL   Lymphocytes Relative 19 %   Lymphs Abs 2.2 0.7 - 4.0 K/uL   Monocytes Relative 6 %   Monocytes Absolute 0.7 0.1 - 1.0 K/uL   Eosinophils Relative 2 %   Eosinophils Absolute 0.2 0.0 - 0.7 K/uL   Basophils Relative 0 %   Basophils Absolute 0.0 0.0 - 0.1 K/uL   US Renal  Result Date: 11/19/2016 CLINICAL DATA:  Cramping pain since 15:00 extending from the right lower quadrant to the mid pelvis. EXAM: RENAL / URINARY TRACT ULTRASOUND COMPLETE COMPARISON:  None. FINDINGS: Right Kidney: Length: 10.7 cm. Echogenicity within normal limits. No mass or hydronephrosis visualized. Left Kidney: Length: 11.5 cm. Echogenicity within normal limits. No mass or hydronephrosis visualized. Bladder: Appears normal for degree of bladder distention. Ureteral jets were documented bilaterally. IMPRESSION: Normal kidneys.  No hydronephrosis. Electronically Signed   By: Andreas Newport M.D.   On: 11/19/2016 00:51   Review of Systems   Constitutional: Negative for fever.  Gastrointestinal: Positive for abdominal pain. Negative for constipation, diarrhea, nausea and vomiting.  Genitourinary: Negative for dysuria, flank pain and frequency.   Physical Exam   Blood pressure 119/79, pulse 106, temperature 98.1 F (36.7 C), resp. rate 18, height _0  (1.575 m), weight 157 lb 12.8 oz (71.6 kg).  Physical Exam  Constitutional: She is oriented to person, place, and time. She appears well-developed and well-nourished.  GI: Soft. Normal appearance. There is tenderness in the suprapubic area. There is no rigidity, no rebound and no guarding.  Genitourinary:  Genitourinary Comments: Dilation: Closed Effacement (%): Thick Cervical Position: Posterior Exam by:: J.Rasch, NP  Musculoskeletal: Normal range of motion.  Neurological: She is alert and oriented to person, place, and time.  Skin: Skin is warm. She is not diaphoretic.  Psychiatric: Her behavior is normal.    Fetal Tracing: Baseline: 140 bpm  Variability: Moderate  Accelerations: 10x10  Decelerations: none Toco: None  MAU Course  Procedures  None  MDM  CBC & CMP UA Renal US  LR bolus  Urine culture  Tylenol 1 gram, patient declined anything stronger.  Discussed patient with Dr. Helane Rima. Patient feeling better following IVF and eating a snack. No contractions noted on fetal monitor, abdomen is soft.   Assessment and Plan    A:  1. Abdominal pain in pregnancy, second trimester     P:  Discharge home in stable condition Pregnancy support belt recommended Keep appointment with OB  Return to MAU if symptoms worsen  Lezlie Lye, NP 11/20/2016 9:36 AM

## 2016-11-19 DIAGNOSIS — O26892 Other specified pregnancy related conditions, second trimester: Secondary | ICD-10-CM

## 2016-11-19 DIAGNOSIS — R109 Unspecified abdominal pain: Secondary | ICD-10-CM

## 2016-11-19 DIAGNOSIS — Z3A27 27 weeks gestation of pregnancy: Secondary | ICD-10-CM

## 2016-11-19 LAB — CBC WITH DIFFERENTIAL/PLATELET
BASOS ABS: 0 10*3/uL (ref 0.0–0.1)
Basophils Relative: 0 %
Eosinophils Absolute: 0.2 10*3/uL (ref 0.0–0.7)
Eosinophils Relative: 2 %
HEMATOCRIT: 31 % — AB (ref 36.0–46.0)
Hemoglobin: 10.6 g/dL — ABNORMAL LOW (ref 12.0–15.0)
LYMPHS ABS: 2.2 10*3/uL (ref 0.7–4.0)
LYMPHS PCT: 19 %
MCH: 30.6 pg (ref 26.0–34.0)
MCHC: 34.2 g/dL (ref 30.0–36.0)
MCV: 89.6 fL (ref 78.0–100.0)
MONO ABS: 0.7 10*3/uL (ref 0.1–1.0)
Monocytes Relative: 6 %
NEUTROS ABS: 8.3 10*3/uL — AB (ref 1.7–7.7)
Neutrophils Relative %: 73 %
Platelets: 238 10*3/uL (ref 150–400)
RBC: 3.46 MIL/uL — ABNORMAL LOW (ref 3.87–5.11)
RDW: 13.7 % (ref 11.5–15.5)
WBC: 11.5 10*3/uL — ABNORMAL HIGH (ref 4.0–10.5)

## 2016-11-19 LAB — COMPREHENSIVE METABOLIC PANEL
ALBUMIN: 3.2 g/dL — AB (ref 3.5–5.0)
ALT: 13 U/L — ABNORMAL LOW (ref 14–54)
ANION GAP: 8 (ref 5–15)
AST: 18 U/L (ref 15–41)
Alkaline Phosphatase: 41 U/L (ref 38–126)
BILIRUBIN TOTAL: 0.4 mg/dL (ref 0.3–1.2)
BUN: 8 mg/dL (ref 6–20)
CHLORIDE: 106 mmol/L (ref 101–111)
CO2: 23 mmol/L (ref 22–32)
Calcium: 8.8 mg/dL — ABNORMAL LOW (ref 8.9–10.3)
Creatinine, Ser: 0.44 mg/dL (ref 0.44–1.00)
GFR calc Af Amer: >60 mL/min (ref >60)
GFR calc non Af Amer: >60 mL/min (ref >60)
Glucose, Bld: 94 mg/dL (ref 65–99)
POTASSIUM: 3.6 mmol/L (ref 3.5–5.1)
SODIUM: 137 mmol/L (ref 135–145)
TOTAL PROTEIN: 6.4 g/dL — AB (ref 6.5–8.1)

## 2016-11-19 MED ORDER — LACTATED RINGERS IV BOLUS (SEPSIS)
500.0000 mL | Freq: Once | INTRAVENOUS | Status: AC
  Administered 2016-11-19: 500 mL via INTRAVENOUS

## 2016-11-19 NOTE — Discharge Instructions (Signed)
Abdominal Pain During Pregnancy °Belly (abdominal) pain is common during pregnancy. Most of the time, it is not a serious problem. Other times, it can be a sign that something is wrong with the pregnancy. Always tell your doctor if you have belly pain. °Follow these instructions at home: °Monitor your belly pain for any changes. The following actions may help you feel better: °· Do not have sex (intercourse) or put anything in your vagina until you feel better. °· Rest until your pain stops. °· Drink clear fluids if you feel sick to your stomach (nauseous). Do not eat solid food until you feel better. °· Only take medicine as told by your doctor. °· Keep all doctor visits as told. °Get help right away if: °· You are bleeding, leaking fluid, or pieces of tissue come out of your vagina. °· You have more pain or cramping. °· You keep throwing up (vomiting). °· You have pain when you pee (urinate) or have blood in your pee. °· You have a fever. °· You do not feel your baby moving as much. °· You feel very weak or feel like passing out. °· You have trouble breathing, with or without belly pain. °· You have a very bad headache and belly pain. °· You have fluid leaking from your vagina and belly pain. °· You keep having watery poop (diarrhea). °· Your belly pain does not go away after resting, or the pain gets worse. °This information is not intended to replace advice given to you by your health care provider. Make sure you discuss any questions you have with your health care provider. °Document Released: 10/18/2009 Document Revised: 06/07/2016 Document Reviewed: 05/29/2013 °Elsevier Interactive Patient Education © 2017 [employer] ° °

## 2016-11-20 LAB — CULTURE, OB URINE
CULTURE: NO GROWTH
Special Requests: NORMAL

## 2016-11-27 ENCOUNTER — Encounter: Payer: 59 | Attending: Obstetrics and Gynecology | Admitting: Skilled Nursing Facility1

## 2016-11-27 DIAGNOSIS — Z3A00 Weeks of gestation of pregnancy not specified: Secondary | ICD-10-CM | POA: Diagnosis not present

## 2016-11-27 DIAGNOSIS — Z713 Dietary counseling and surveillance: Secondary | ICD-10-CM | POA: Insufficient documentation

## 2016-11-27 DIAGNOSIS — O99810 Abnormal glucose complicating pregnancy: Secondary | ICD-10-CM | POA: Diagnosis present

## 2016-11-28 ENCOUNTER — Encounter: Payer: Self-pay | Admitting: Skilled Nursing Facility1

## 2016-11-28 NOTE — Progress Notes (Signed)
  Patient was seen on 11/27/2016 for Gestational Diabetes self-management class at the [facility]. The following learning objectives were met by the patient during this course:   States the definition of Gestational Diabetes  States why dietary management is important in controlling blood glucose  Describes the effects each nutrient has on blood glucose levels  Demonstrates ability to create a balanced meal plan  Demonstrates carbohydrate counting   States when to check blood glucose levels involving a total of 4 separate occurences in a day  Demonstrates proper blood glucose monitoring techniques  States the effect of stress and exercise on blood glucose levels  States the importance of limiting caffeine and abstaining from alcohol and smoking  Demonstrates the knowledge the glucometer provided in class may not be covered by their insurance and to call their insurance provider immediately after class to know which glucometer their insurance provider does cover as well as calling their physician the next day for a prescription to the glucometer their insurance does cover (if the one provided is not) as well as the lancets and strips for that meter.  Blood glucose monitor given: accu check guide Lot # [id number] Exp: 02/19 Blood glucose reading: 72  Patient instructed to monitor glucose levels: FBS: 60 - <90 1 hour: <140 2 hour: <120  *Patient received handouts:  Nutrition Diabetes and Pregnancy  Carbohydrate Counting List  Patient will be seen for follow-up as needed.

## 2016-12-20 ENCOUNTER — Other Ambulatory Visit: Payer: Self-pay | Admitting: Obstetrics and Gynecology

## 2016-12-20 DIAGNOSIS — N6320 Unspecified lump in the left breast, unspecified quadrant: Secondary | ICD-10-CM

## 2017-01-03 ENCOUNTER — Other Ambulatory Visit: Payer: 59

## 2017-01-05 ENCOUNTER — Other Ambulatory Visit: Payer: Self-pay | Admitting: Obstetrics and Gynecology

## 2017-01-05 ENCOUNTER — Ambulatory Visit
Admission: RE | Admit: 2017-01-05 | Discharge: 2017-01-05 | Disposition: A | Discharge status: Home / Self Care | Payer: 59 | Source: Ambulatory Visit | Attending: Obstetrics and Gynecology | Admitting: Obstetrics and Gynecology

## 2017-01-05 DIAGNOSIS — N6320 Unspecified lump in the left breast, unspecified quadrant: Secondary | ICD-10-CM

## 2017-02-09 ENCOUNTER — Telehealth (HOSPITAL_COMMUNITY): Payer: Self-pay | Admitting: *Deleted

## 2017-02-09 ENCOUNTER — Encounter (HOSPITAL_COMMUNITY): Payer: Self-pay | Admitting: *Deleted

## 2017-02-09 LAB — OB RESULTS CONSOLE GBS: GBS: NEGATIVE

## 2017-02-09 NOTE — Telephone Encounter (Signed)
Preadmission screen  

## 2017-02-11 ENCOUNTER — Inpatient Hospital Stay (HOSPITAL_COMMUNITY): Payer: 59 | Admitting: Anesthesiology

## 2017-02-11 ENCOUNTER — Inpatient Hospital Stay (HOSPITAL_COMMUNITY): Payer: 59

## 2017-02-11 ENCOUNTER — Encounter (HOSPITAL_COMMUNITY)
Admission: AD | Disposition: A | Discharge status: Home / Self Care | Payer: Self-pay | Source: Ambulatory Visit | Attending: Obstetrics and Gynecology

## 2017-02-11 ENCOUNTER — Inpatient Hospital Stay (HOSPITAL_COMMUNITY)
Admission: AD | Admit: 2017-02-11 | Discharge: 2017-02-15 | DRG: 765 | Disposition: A | Discharge status: Home / Self Care | Payer: 59 | Source: Ambulatory Visit | Attending: Obstetrics and Gynecology | Admitting: Obstetrics and Gynecology

## 2017-02-11 DIAGNOSIS — Z3A40 40 weeks gestation of pregnancy: Secondary | ICD-10-CM | POA: Diagnosis not present

## 2017-02-11 DIAGNOSIS — D62 Acute posthemorrhagic anemia: Secondary | ICD-10-CM | POA: Diagnosis not present

## 2017-02-11 DIAGNOSIS — O4693 Antepartum hemorrhage, unspecified, third trimester: Secondary | ICD-10-CM | POA: Diagnosis present

## 2017-02-11 DIAGNOSIS — O860 Infection of obstetric surgical wound: Secondary | ICD-10-CM | POA: Diagnosis present

## 2017-02-11 DIAGNOSIS — O9081 Anemia of the puerperium: Secondary | ICD-10-CM | POA: Diagnosis not present

## 2017-02-11 DIAGNOSIS — Z87891 Personal history of nicotine dependence: Secondary | ICD-10-CM

## 2017-02-11 DIAGNOSIS — O24420 Gestational diabetes mellitus in childbirth, diet controlled: Secondary | ICD-10-CM | POA: Diagnosis present

## 2017-02-11 DIAGNOSIS — O4590 Premature separation of placenta, unspecified, unspecified trimester: Secondary | ICD-10-CM | POA: Diagnosis present

## 2017-02-11 DIAGNOSIS — O4593 Premature separation of placenta, unspecified, third trimester: Principal | ICD-10-CM | POA: Diagnosis present

## 2017-02-11 DIAGNOSIS — O468X3 Other antepartum hemorrhage, third trimester: Secondary | ICD-10-CM

## 2017-02-11 DIAGNOSIS — Z3A39 39 weeks gestation of pregnancy: Secondary | ICD-10-CM

## 2017-02-11 LAB — CBC
HCT: 34.2 % — ABNORMAL LOW (ref 36.0–46.0)
HEMOGLOBIN: 11.9 g/dL — AB (ref 12.0–15.0)
MCH: 31.4 pg (ref 26.0–34.0)
MCHC: 34.8 g/dL (ref 30.0–36.0)
MCV: 90.2 fL (ref 78.0–100.0)
PLATELETS: 210 10*3/uL (ref 150–400)
RBC: 3.79 MIL/uL — ABNORMAL LOW (ref 3.87–5.11)
RDW: 13.9 % (ref 11.5–15.5)
WBC: 9.5 10*3/uL (ref 4.0–10.5)

## 2017-02-11 LAB — GLUCOSE, CAPILLARY: GLUCOSE-CAPILLARY: 86 mg/dL (ref 65–99)

## 2017-02-11 LAB — POCT FERN TEST: POCT FERN TEST: NEGATIVE

## 2017-02-11 SURGERY — Surgical Case
Anesthesia: Spinal | Site: Abdomen | Wound class: Clean Contaminated

## 2017-02-11 MED ORDER — SIMETHICONE 80 MG PO CHEW: 80.0000 mg | CHEWABLE_TABLET | ORAL | Status: DC | PRN

## 2017-02-11 MED ORDER — DIPHENHYDRAMINE HCL 50 MG/ML IJ SOLN: 12.5000 mg | Freq: Four times a day (QID) | INTRAMUSCULAR | Status: DC | PRN

## 2017-02-11 MED ORDER — METHYLERGONOVINE MALEATE 0.2 MG/ML IJ SOLN
INTRAMUSCULAR | Status: AC
  Filled 2017-02-11: qty 1

## 2017-02-11 MED ORDER — NALOXONE HCL 0.4 MG/ML IJ SOLN: 0.4000 mg | INTRAMUSCULAR | Status: DC | PRN

## 2017-02-11 MED ORDER — DIBUCAINE 1 % RE OINT
1.0000 | TOPICAL_OINTMENT | RECTAL | Status: DC | PRN
Start: 2017-02-11 — End: 2017-02-15

## 2017-02-11 MED ORDER — SCOPOLAMINE 1 MG/3DAYS TD PT72
MEDICATED_PATCH | TRANSDERMAL | Status: DC | PRN
  Administered 2017-02-11: 1 via TRANSDERMAL

## 2017-02-11 MED ORDER — COCONUT OIL OIL: 1.0000 "application " | TOPICAL_OIL | Status: DC | PRN

## 2017-02-11 MED ORDER — ONDANSETRON HCL 4 MG/2ML IJ SOLN: 4.0000 mg | Freq: Four times a day (QID) | INTRAMUSCULAR | Status: DC | PRN

## 2017-02-11 MED ORDER — WITCH HAZEL-GLYCERIN EX PADS: 1.0000 "application " | MEDICATED_PAD | CUTANEOUS | Status: DC | PRN

## 2017-02-11 MED ORDER — DIPHENHYDRAMINE HCL 25 MG PO CAPS
25.0000 mg | ORAL_CAPSULE | ORAL | Status: DC | PRN
  Filled 2017-02-11: qty 1

## 2017-02-11 MED ORDER — PANTOPRAZOLE SODIUM 40 MG PO TBEC
40.0000 mg | DELAYED_RELEASE_TABLET | Freq: Two times a day (BID) | ORAL | Status: DC
  Administered 2017-02-12 – 2017-02-15 (×7): 40 mg via ORAL
  Filled 2017-02-11 (×7): qty 1

## 2017-02-11 MED ORDER — METHYLERGONOVINE MALEATE 0.2 MG/ML IJ SOLN
INTRAMUSCULAR | Status: DC | PRN
  Administered 2017-02-11: 0.2 mg via INTRAMUSCULAR

## 2017-02-11 MED ORDER — MISOPROSTOL 200 MCG PO TABS
800.0000 ug | ORAL_TABLET | Freq: Once | ORAL | Status: AC
  Administered 2017-02-11: 800 ug via RECTAL

## 2017-02-11 MED ORDER — SCOPOLAMINE 1 MG/3DAYS TD PT72
MEDICATED_PATCH | TRANSDERMAL | Status: AC
  Filled 2017-02-11: qty 1

## 2017-02-11 MED ORDER — MORPHINE SULFATE (PF) 0.5 MG/ML IJ SOLN
INTRAMUSCULAR | Status: AC
  Filled 2017-02-11: qty 10

## 2017-02-11 MED ORDER — CHLOROPROCAINE HCL (PF) 3 % IJ SOLN
INTRAMUSCULAR | Status: AC
  Filled 2017-02-11: qty 20

## 2017-02-11 MED ORDER — ONDANSETRON HCL 4 MG/2ML IJ SOLN
INTRAMUSCULAR | Status: AC
  Filled 2017-02-11: qty 2

## 2017-02-11 MED ORDER — MEPERIDINE HCL 25 MG/ML IJ SOLN: 6.2500 mg | INTRAMUSCULAR | Status: DC | PRN

## 2017-02-11 MED ORDER — OXYTOCIN 10 UNIT/ML IJ SOLN
INTRAMUSCULAR | Status: AC
  Filled 2017-02-11: qty 4

## 2017-02-11 MED ORDER — SODIUM CHLORIDE 0.9% FLUSH: 9.0000 mL | INTRAVENOUS | Status: DC | PRN

## 2017-02-11 MED ORDER — FENTANYL CITRATE (PF) 100 MCG/2ML IJ SOLN
INTRAMUSCULAR | Status: AC
  Filled 2017-02-11: qty 2

## 2017-02-11 MED ORDER — TETANUS-DIPHTH-ACELL PERTUSSIS 5-2.5-18.5 LF-MCG/0.5 IM SUSP: 0.5000 mL | Freq: Once | INTRAMUSCULAR | Status: DC

## 2017-02-11 MED ORDER — DEXTROSE IN LACTATED RINGERS 5 % IV SOLN
INTRAVENOUS | Status: DC
  Administered 2017-02-12: 13:00:00 via INTRAVENOUS

## 2017-02-11 MED ORDER — LACTATED RINGERS IV SOLN
INTRAVENOUS | Status: DC | PRN
  Administered 2017-02-11 (×3): via INTRAVENOUS

## 2017-02-11 MED ORDER — FAMOTIDINE IN NACL 20-0.9 MG/50ML-% IV SOLN
INTRAVENOUS | Status: AC
  Filled 2017-02-11: qty 50

## 2017-02-11 MED ORDER — ONDANSETRON HCL 4 MG/2ML IJ SOLN
4.0000 mg | Freq: Three times a day (TID) | INTRAMUSCULAR | Status: DC | PRN
  Administered 2017-02-11: 4 mg via INTRAVENOUS

## 2017-02-11 MED ORDER — MEDROXYPROGESTERONE ACETATE 150 MG/ML IM SUSP: 150.0000 mg | INTRAMUSCULAR | Status: DC | PRN

## 2017-02-11 MED ORDER — CEFAZOLIN SODIUM-DEXTROSE 2-4 GM/100ML-% IV SOLN
2.0000 g | INTRAVENOUS | Status: AC
  Administered 2017-02-11: 2 g via INTRAVENOUS

## 2017-02-11 MED ORDER — CHLOROPROCAINE HCL (PF) 3 % IJ SOLN
INTRAMUSCULAR | Status: DC | PRN
  Administered 2017-02-11: 20 mL

## 2017-02-11 MED ORDER — ACETAMINOPHEN 325 MG PO TABS
650.0000 mg | ORAL_TABLET | ORAL | Status: DC | PRN
  Administered 2017-02-14: 650 mg via ORAL
  Filled 2017-02-11 (×2): qty 2

## 2017-02-11 MED ORDER — SOD CITRATE-CITRIC ACID 500-334 MG/5ML PO SOLN
30.0000 mL | Freq: Once | ORAL | Status: AC
  Administered 2017-02-11: 30 mL via ORAL

## 2017-02-11 MED ORDER — NALBUPHINE HCL 10 MG/ML IJ SOLN
5.0000 mg | INTRAMUSCULAR | Status: DC | PRN
  Administered 2017-02-12 (×2): 5 mg via INTRAVENOUS
  Filled 2017-02-11 (×2): qty 1

## 2017-02-11 MED ORDER — MORPHINE SULFATE 2 MG/ML IV SOLN
INTRAVENOUS | Status: DC
  Administered 2017-02-11: 22:00:00 via INTRAVENOUS
  Filled 2017-02-11: qty 30

## 2017-02-11 MED ORDER — LACTATED RINGERS IV BOLUS (SEPSIS): 1000.0000 mL | Freq: Once | INTRAVENOUS | Status: DC

## 2017-02-11 MED ORDER — PRENATAL MULTIVITAMIN CH: 1.0000 | ORAL_TABLET | Freq: Every day | ORAL | Status: DC

## 2017-02-11 MED ORDER — DIPHENHYDRAMINE HCL 25 MG PO CAPS: 25.0000 mg | ORAL_CAPSULE | Freq: Four times a day (QID) | ORAL | Status: DC | PRN

## 2017-02-11 MED ORDER — OXYTOCIN 10 UNIT/ML IJ SOLN
INTRAVENOUS | Status: DC | PRN
  Administered 2017-02-11: 40 [IU] via INTRAVENOUS

## 2017-02-11 MED ORDER — FAMOTIDINE IN NACL 20-0.9 MG/50ML-% IV SOLN
20.0000 mg | Freq: Once | INTRAVENOUS | Status: AC
  Administered 2017-02-11: 20 mg via INTRAVENOUS

## 2017-02-11 MED ORDER — NALBUPHINE HCL 10 MG/ML IJ SOLN: 5.0000 mg | INTRAMUSCULAR | Status: DC | PRN

## 2017-02-11 MED ORDER — OXYCODONE-ACETAMINOPHEN 5-325 MG PO TABS: 2.0000 | ORAL_TABLET | ORAL | Status: DC | PRN

## 2017-02-11 MED ORDER — SCOPOLAMINE 1 MG/3DAYS TD PT72: 1.0000 | MEDICATED_PATCH | Freq: Once | TRANSDERMAL | Status: DC

## 2017-02-11 MED ORDER — MISOPROSTOL 200 MCG PO TABS: 800.0000 ug | ORAL_TABLET | Freq: Once | ORAL | Status: DC

## 2017-02-11 MED ORDER — ACETAMINOPHEN 500 MG PO TABS
1000.0000 mg | ORAL_TABLET | Freq: Four times a day (QID) | ORAL | Status: DC
  Administered 2017-02-12: 1000 mg via ORAL
  Filled 2017-02-11: qty 2

## 2017-02-11 MED ORDER — OXYCODONE-ACETAMINOPHEN 5-325 MG PO TABS: 1.0000 | ORAL_TABLET | ORAL | Status: DC | PRN

## 2017-02-11 MED ORDER — PHENYLEPHRINE 8 MG IN D5W 100 ML (0.08MG/ML) PREMIX OPTIME
INJECTION | INTRAVENOUS | Status: AC
  Filled 2017-02-11: qty 100

## 2017-02-11 MED ORDER — MISOPROSTOL 200 MCG PO TABS
ORAL_TABLET | ORAL | Status: AC
  Administered 2017-02-11: 800 ug via RECTAL
  Filled 2017-02-11: qty 4

## 2017-02-11 MED ORDER — NALBUPHINE HCL 10 MG/ML IJ SOLN
5.0000 mg | Freq: Once | INTRAMUSCULAR | Status: DC | PRN
Start: 2017-02-11 — End: 2017-02-15

## 2017-02-11 MED ORDER — SENNOSIDES-DOCUSATE SODIUM 8.6-50 MG PO TABS
2.0000 | ORAL_TABLET | ORAL | Status: DC
  Administered 2017-02-12 – 2017-02-15 (×3): 2 via ORAL
  Filled 2017-02-11 (×3): qty 2

## 2017-02-11 MED ORDER — NALBUPHINE HCL 10 MG/ML IJ SOLN: 5.0000 mg | Freq: Once | INTRAMUSCULAR | Status: DC | PRN

## 2017-02-11 MED ORDER — SIMETHICONE 80 MG PO CHEW
80.0000 mg | CHEWABLE_TABLET | Freq: Three times a day (TID) | ORAL | Status: DC
  Administered 2017-02-12 – 2017-02-15 (×9): 80 mg via ORAL
  Filled 2017-02-11 (×8): qty 1

## 2017-02-11 MED ORDER — MORPHINE SULFATE (PF) 4 MG/ML IV SOLN: 2.0000 mg | Freq: Once | INTRAVENOUS | Status: DC

## 2017-02-11 MED ORDER — FENTANYL CITRATE (PF) 100 MCG/2ML IJ SOLN
INTRAMUSCULAR | Status: DC | PRN
  Administered 2017-02-11: 75 ug via INTRAVENOUS

## 2017-02-11 MED ORDER — PHENYLEPHRINE 8 MG IN D5W 100 ML (0.08MG/ML) PREMIX OPTIME
INJECTION | INTRAVENOUS | Status: DC | PRN
  Administered 2017-02-11: 60 ug/min via INTRAVENOUS

## 2017-02-11 MED ORDER — MEASLES, MUMPS & RUBELLA VAC ~~LOC~~ INJ: 0.5000 mL | INJECTION | Freq: Once | SUBCUTANEOUS | Status: DC

## 2017-02-11 MED ORDER — ONDANSETRON HCL 4 MG/2ML IJ SOLN
INTRAMUSCULAR | Status: AC
  Administered 2017-02-11: 4 mg via INTRAVENOUS
  Filled 2017-02-11: qty 2

## 2017-02-11 MED ORDER — DIPHENHYDRAMINE HCL 50 MG/ML IJ SOLN: 12.5000 mg | INTRAMUSCULAR | Status: DC | PRN

## 2017-02-11 MED ORDER — IBUPROFEN 600 MG PO TABS
600.0000 mg | ORAL_TABLET | Freq: Four times a day (QID) | ORAL | Status: DC
  Filled 2017-02-11: qty 1

## 2017-02-11 MED ORDER — PROMETHAZINE HCL 25 MG/ML IJ SOLN
6.2500 mg | INTRAMUSCULAR | Status: DC | PRN
  Administered 2017-02-11: 6.25 mg via INTRAVENOUS
  Filled 2017-02-11: qty 1

## 2017-02-11 MED ORDER — ACETAMINOPHEN 160 MG/5ML PO SOLN
ORAL | Status: AC
  Administered 2017-02-11: 1000 mg via ORAL
  Filled 2017-02-11: qty 40.6

## 2017-02-11 MED ORDER — OXYTOCIN 40 UNITS IN LACTATED RINGERS INFUSION - SIMPLE MED
2.5000 [IU]/h | INTRAVENOUS | Status: AC
  Administered 2017-02-11: 2.5 [IU]/h via INTRAVENOUS

## 2017-02-11 MED ORDER — ONDANSETRON HCL 4 MG/2ML IJ SOLN
INTRAMUSCULAR | Status: DC | PRN
  Administered 2017-02-11: 4 mg via INTRAVENOUS

## 2017-02-11 MED ORDER — NALOXONE HCL 2 MG/2ML IJ SOSY: 1.0000 ug/kg/h | PREFILLED_SYRINGE | INTRAVENOUS | Status: DC | PRN

## 2017-02-11 MED ORDER — SIMETHICONE 80 MG PO CHEW
80.0000 mg | CHEWABLE_TABLET | ORAL | Status: DC
  Administered 2017-02-12 – 2017-02-15 (×3): 80 mg via ORAL
  Filled 2017-02-11 (×3): qty 1

## 2017-02-11 MED ORDER — ACETAMINOPHEN 160 MG/5ML PO SOLN
1000.0000 mg | Freq: Four times a day (QID) | ORAL | Status: DC | PRN
Start: 2017-02-11 — End: 2017-02-15
  Administered 2017-02-11 – 2017-02-15 (×7): 1000 mg via ORAL
  Filled 2017-02-11 (×9): qty 40.6

## 2017-02-11 MED ORDER — SODIUM CHLORIDE 0.9% FLUSH: 3.0000 mL | INTRAVENOUS | Status: DC | PRN

## 2017-02-11 MED ORDER — OXYTOCIN 40 UNITS IN LACTATED RINGERS INFUSION - SIMPLE MED
INTRAVENOUS | Status: AC
Start: 2017-02-11 — End: 2017-02-12
  Filled 2017-02-11: qty 1000

## 2017-02-11 MED ORDER — DIPHENHYDRAMINE HCL 12.5 MG/5ML PO ELIX
12.5000 mg | ORAL_SOLUTION | Freq: Four times a day (QID) | ORAL | Status: DC | PRN
  Filled 2017-02-11: qty 5

## 2017-02-11 MED ORDER — MENTHOL 3 MG MT LOZG: 1.0000 | LOZENGE | OROMUCOSAL | Status: DC | PRN

## 2017-02-11 MED ORDER — SOD CITRATE-CITRIC ACID 500-334 MG/5ML PO SOLN
ORAL | Status: AC
  Administered 2017-02-11: 30 mL
  Filled 2017-02-11: qty 15

## 2017-02-11 SURGICAL SUPPLY — 30 items
CHLORAPREP W/TINT 26ML (MISCELLANEOUS) ×2 IMPLANT
CLAMP CORD UMBIL (MISCELLANEOUS) IMPLANT
CLOTH BEACON ORANGE TIMEOUT ST (SAFETY) ×2 IMPLANT
DERMABOND ADVANCED (GAUZE/BANDAGES/DRESSINGS) ×1
DERMABOND ADVANCED .7 DNX12 (GAUZE/BANDAGES/DRESSINGS) ×1 IMPLANT
DRSG OPSITE POSTOP 4X10 (GAUZE/BANDAGES/DRESSINGS) ×2 IMPLANT
ELECT REM PT RETURN 9FT ADLT (ELECTROSURGICAL) ×2
ELECTRODE REM PT RTRN 9FT ADLT (ELECTROSURGICAL) ×1 IMPLANT
EXTRACTOR VACUUM M CUP 4 TUBE (SUCTIONS) IMPLANT
GLOVE BIO SURGEON STRL SZ 6.5 (GLOVE) ×2 IMPLANT
GLOVE BIOGEL PI IND STRL 7.0 (GLOVE) ×2 IMPLANT
GLOVE BIOGEL PI INDICATOR 7.0 (GLOVE) ×2
GOWN STRL REUS W/TWL LRG LVL3 (GOWN DISPOSABLE) ×4 IMPLANT
KIT ABG SYR 3ML LUER SLIP (SYRINGE) IMPLANT
NEEDLE HYPO 25X5/8 SAFETYGLIDE (NEEDLE) IMPLANT
NS IRRIG 1000ML POUR BTL (IV SOLUTION) ×2 IMPLANT
PACK C SECTION WH (CUSTOM PROCEDURE TRAY) ×2 IMPLANT
PAD OB MATERNITY 4.3X12.25 (PERSONAL CARE ITEMS) ×2 IMPLANT
PENCIL BUTTON HOLSTER BLD 10FT (ELECTRODE) ×2 IMPLANT
PENCIL SMOKE EVAC W/HOLSTER (ELECTROSURGICAL) ×2 IMPLANT
SPONGE LAP 18X18 X RAY DECT (DISPOSABLE) ×4 IMPLANT
SUT CHROMIC 0 CT 802H (SUTURE) IMPLANT
SUT CHROMIC 0 CTX 36 (SUTURE) ×10 IMPLANT
SUT MON AB-0 CT1 36 (SUTURE) ×2 IMPLANT
SUT PDS AB 0 CTX 60 (SUTURE) ×2 IMPLANT
SUT PLAIN 0 NONE (SUTURE) IMPLANT
SUT VIC AB 4-0 KS 27 (SUTURE) IMPLANT
SYR BULB 3OZ (MISCELLANEOUS) ×2 IMPLANT
TOWEL OR 17X24 6PK STRL BLUE (TOWEL DISPOSABLE) ×2 IMPLANT
TRAY FOLEY BAG SILVER LF 14FR (SET/KITS/TRAYS/PACK) IMPLANT

## 2017-02-11 NOTE — Progress Notes (Signed)
US at bedside

## 2017-02-11 NOTE — Op Note (Signed)
Cesarean Section Procedure Note   [name]  02/11/2017  Indications: vaginal bleeding   Pre-operative Diagnosis: bleeding- suspected abruption.  Post-operative Diagnosis: Same   Surgeon: Surgeon(s) and Role:       * Marylynn Pearson, MD - Primary   Assist:  Redmond School, MD   Anesthesia: spinal   Procedure Details:  The patient was seen in the Holding Room. The risks, benefits, complications, treatment options, and expected outcomes were discussed with the patient. The patient concurred with the proposed plan, giving informed consent. identified as [name] and the procedure verified as C-Section Delivery. A Time Out was held and the above information confirmed.  After induction of anesthesia, the patient was draped and prepped in the usual sterile manner. A transverse was made and carried down through the subcutaneous tissue to the fascia. Fascial incision was made and extended transversely. The fascia was separated from the underlying rectus tissue superiorly and inferiorly. The peritoneum was identified and entered. Peritoneal incision was extended longitudinally. The utero-vesical peritoneal reflection was incised transversely and the bladder flap was bluntly freed from the lower uterine segment. A low transverse uterine incision was made. Delivered from cephalic presentation was a viable female infant.   Cord ph was not sent the umbilical cord was clamped and cut cord blood was obtained for evaluation. The placenta was removed Intact and appeared normal. The uterine outline, tubes and ovaries appeared normal. The uterine incision was closed with running locked sutures of 0 chromic gut.  Left extension with small hematoma noted.  Dr Glo Herring called to assist.  Extension was suture ligated and hemostasis was observed. Lavage was carried out until clear.  The fascia was then reapproximated with running sutures of 0PDS. The skin was closed with 4-0Vicryl.   Instrument, sponge, and  needle counts were correct prior the abdominal closure and were correct at the conclusion of the case.     Estimated Blood Loss:  900cc  Urine Output: 300cc  Specimens: placenta  Complications: no complications  Disposition: PACU - hemodynamically stable.   Maternal Condition: stable   Baby condition / location:  Couplet care / Skin to Skin  Attending Attestation: I was present and scrubbed for the entire procedure.   Signed: Marylynn Pearson, MD

## 2017-02-11 NOTE — MAU Note (Signed)
EMS arrival, pt's family waiting on arrival of pt and came with her.  Pt for induction tonight.  Noted bleeding and loss of water when went to the bathroom.  CBG 115, IV- 20gu in rt hand

## 2017-02-11 NOTE — Transfer of Care (Signed)
Immediate Anesthesia Transfer of Care Note  Patient: [name]  Procedure(s) Performed: Procedure(s): CESAREAN SECTION (N/A)  Patient Location: PACU  Anesthesia Type:Spinal  Level of Consciousness: awake  Airway & Oxygen Therapy: Patient Spontanous Breathing  Post-op Assessment: Report given to RN and Post -op Vital signs reviewed and stable  Post vital signs: stable  Last Vitals:  Vitals:   02/11/17 1700 02/11/17 1705  BP:    Pulse:    Resp: 16 18    Last Pain:  Vitals:   02/11/17 1633  PainSc: 8          Complications: No apparent anesthesia complications

## 2017-02-11 NOTE — Consult Note (Signed)
Neonatology Note:   Attendance at C-section:    I was asked by Dr. Julien Girt to attend this primary C/S at term due to concerns for placental abruption. The mother is a G2P0010, GBS negative with good prenatal care. PNH notable for anxiety for ehich takes Sertraline.  ROM at delivery, fluid meconium. Infant vigorous with good spontaneous cry and tone.  60 sec DCC.  Needed only minimal bulb suctioning. Ap 8/9. +void.  Lungs clear to ausc in DR. Pink color.  To CN to care of Pediatrician.  Monia Sabal Katherina Mires, MD

## 2017-02-11 NOTE — Anesthesia Procedure Notes (Signed)
Spinal  Patient location during procedure: OR Staffing Anesthesiologist: Lyndle Herrlich Preanesthetic Checklist Completed: patient identified, site marked, surgical consent, pre-op evaluation, timeout performed, IV checked, risks and benefits discussed and monitors and equipment checked Spinal Block Patient position: sitting Prep: DuraPrep Patient monitoring: heart rate, cardiac monitor, continuous pulse ox and blood pressure Approach: midline Location: L3-4 Injection technique: single-shot Needle Needle type: Sprotte  Needle gauge: 24 G Needle length: 9 cm Assessment Sensory level: T4 Additional Notes Spinal Dosage in OR  Bupivicaine ml       1.2 PFMS04   mcg        100 Fentanyl mcg            25

## 2017-02-11 NOTE — MAU Provider Note (Signed)
History     CSN: [id number]  Arrival date and time: 02/11/17 1626   First Provider Initiated Contact with Patient 02/11/17 1648      Chief Complaint  Patient presents with  . Vaginal Bleeding   HPI [name] is a 31 y.o. G2P0010 at [redacted]w[redacted]d who presents via EMS for vaginal bleeding. Had gush of fluid & thought water broke but it was large amount of blood with some clots. Reports occasional contractions that feel like bad period cramps & rates 8/10. Denies recent abdominal trauma, fall, or MVA. Denies complications with pregnancy. Positive fetal movement.   OB History    Gravida Para Term Preterm AB Living   2       1     SAB TAB Ectopic Multiple Live Births   1              Past Medical History:  Diagnosis Date  . Anxiety    panic attacks  . Asthma   . Dysrhythmia    pt states she's scheduled to waer halter monitor in July d/t "skipped beats"  . GERD (gastroesophageal reflux disease)    hx of " stomach ulcers"  . H. pylori infection    in middle school  . Headache(784.0)    migraines  . History of peptic ulcer   . History of syncope    2011--  W/ PALPITATIONS  --  ECHO NORMAL  . OCD (obsessive compulsive disorder)   . Ureteral calculi    BILATERAL    Past Surgical History:  Procedure Laterality Date  . CERVIX LESION DESTRUCTION    . CYSTOSCOPY W/ URETERAL STENT REMOVAL Bilateral 05/22/2013   Procedure: CYSTOSCOPY WITH STENT REMOVAL;  Surgeon: Alexis Frock, MD;  Location: [facility];  Service: Urology;  Laterality: Bilateral;  cysto and bilateral stent pull  . CYSTOSCOPY WITH RETROGRADE PYELOGRAM, URETEROSCOPY AND STENT PLACEMENT Bilateral 05/07/2013   Procedure: CYSTOSCOPY WITH RETROGRADE PYELOGRAM, URETEROSCOPY,  BASKET STONE EXTRACTIONS AND STENT PLACEMENT, BILATERAL;  Surgeon: Alexis Frock, MD;  Location: [facility];  Service: Urology;  Laterality: Bilateral;  . DILATION AND EVACUATION N/A 07/10/2013   Procedure: DILATATION  AND EVACUATION;  Surgeon: Margarette Asal, MD;  Location: [facility];  Service: Gynecology;  Laterality: N/A;  . EXTRACORPOREAL SHOCK WAVE LITHOTRIPSY    . LAPAROSCOPY N/A 03/12/2014   Procedure: LAPAROSCOPY DIAGNOSTIC;  Surgeon: Margarette Asal, MD;  Location: [facility];  Service: Gynecology;  Laterality: N/A;  . LEFT URETERSCOPIC STONE EXTRACTION  07-05-2006  . MASS EXCISION Left 09/08/2015   Procedure: EXCISION OF LEFT WRIST  MASS;  Surgeon: Charlotte Crumb, MD;  Location: [facility];  Service: Orthopedics;  Laterality: Left;  . TONSILLECTOMY AND ADENOIDECTOMY    . TRANSTHORACIC ECHOCARDIOGRAM  10-11-2010   NORMAL LVF/ EF 55-65%/ NORMAL VALVE'S  . TYMPANOSTOMY TUBE PLACEMENT    . WISDOM TOOTH EXTRACTION      Family History  Problem Relation Age of Onset  . Diabetes Mother   . Arthritis Mother   . Hypertension Mother   . Heart disease Mother   . Hyperlipidemia Mother   . Thyroid disease Mother   . Melanoma Mother   . Cancer Father   . Arthritis Father   . Cancer Maternal Aunt   . Leukemia Maternal Aunt   . Hyperlipidemia Maternal Aunt   . Cancer Maternal Uncle   . Hyperlipidemia Maternal Uncle   . Hypertension Maternal Uncle   . Diabetes Maternal Uncle   .  Thyroid disease Maternal Uncle   . Cancer Paternal Aunt   . Heart disease Paternal Aunt   . Arthritis Maternal Grandmother   . Hyperlipidemia Maternal Grandmother   . Deep vein thrombosis Maternal Grandmother   . Heart disease Maternal Grandfather   . Hyperlipidemia Maternal Grandfather   . Cancer Paternal Grandfather   . Breast cancer Paternal Grandmother   . Colon cancer Neg Hx   . Stomach cancer Neg Hx     Social History  Substance Use Topics  . Smoking status: Former Smoker    Packs/day: 0.50    Years: 6.00    Types: Cigarettes  . Smokeless tobacco: Never Used  . Alcohol use No    Allergies:  Allergies  Allergen Reactions  . Dilaudid [Hydromorphone Hcl] Nausea And Vomiting  . Xanax  [Alprazolam]   . Iodine Rash    Prescriptions Prior to Admission  Medication Sig Dispense Refill Last Dose  . aspirin 81 MG tablet Take 81 mg by mouth daily.   Past Week at Unknown time  . pantoprazole (PROTONIX) 40 MG tablet TAKE 1 TABLET BY MOUTH TWICE DAILY 60 tablet 0 09/07/2015 at Unknown time  . promethazine (PHENERGAN) 25 MG tablet Take 1 tablet (25 mg total) by mouth every 6 (six) hours as needed for nausea. 30 tablet 0 09/07/2015 at Unknown time  . sertraline (ZOLOFT) 50 MG tablet        Review of Systems  Constitutional: Negative.   Gastrointestinal: Positive for abdominal pain.  Genitourinary: Positive for vaginal bleeding.   Physical Exam   Blood pressure 139/89, pulse (!) 112, resp. rate 18, height 5\' 2"  (1.575 m), weight 171 lb (77.6 kg), SpO2 99 %.  Physical Exam  Nursing note and vitals reviewed. Constitutional: She is oriented to person, place, and time. She appears well-developed and well-nourished. No distress.  HENT:  Head: Normocephalic and atraumatic.  Eyes: Conjunctivae are normal. Right eye exhibits no discharge. Left eye exhibits no discharge. No scleral icterus.  Neck: Normal range of motion.  Respiratory: Effort normal. No respiratory distress.  GI: Soft. There is no tenderness.  Ctx palpate moderate w/adequate rest between  Genitourinary: There is bleeding (moderate amount of frank blood; unable to visualize cervix d/t bleeding) in the vagina.  Neurological: She is alert and oriented to person, place, and time.  Skin: Skin is warm and dry. She is not diaphoretic.  Psychiatric: She has a normal mood and affect. Her behavior is normal. Judgment and thought content normal.   Fetal Tracing:  Baseline: 145 Variability: moderate Accelerations: 15x15 Decelerations: none  Toco: TOCO adjusted -- ctx appear 4-6 mins  MAU Course  Procedures Results for orders placed or performed during the hospital encounter of 02/11/17 (from the past 24 hour(s))  CBC      Status: Abnormal   Collection Time: 02/11/17  4:40 PM  Result Value Ref Range   WBC 9.5 4.0 - 10.5 K/uL   RBC 3.79 (L) 3.87 - 5.11 MIL/uL   Hemoglobin 11.9 (L) 12.0 - 15.0 g/dL   HCT 34.2 (L) 36.0 - 46.0 %   MCV 90.2 78.0 - 100.0 fL   MCH 31.4 26.0 - 34.0 pg   MCHC 34.8 30.0 - 36.0 g/dL   RDW 13.9 11.5 - 15.5 %   Platelets 210 150 - 400 K/uL  POCT fern test     Status: None   Collection Time: 02/11/17  5:08 PM  Result Value Ref Range   POCT Fern Test Negative = intact  amniotic membranes     MDM Category 1 tracing Fern negative  Moderate amount of bleeding on exam -- bedside ultrasound ordered Pt presents with 20G IV placed by EMS -- 18G IV started by MAU RN CBC, type & screen Dr. Julien Girt en route to some see patient   Dr. Julien Girt on bedside -- will take to OR for c/section Assessment and Plan

## 2017-02-11 NOTE — H&P (Addendum)
[name] is a 31 y.o. female G1P0 presenting for vaginal bleeding.  Pt arrived to [facility] via EMS after onset of vaginal bleeding.  Upon arrival, continues to have moderate bleeding with clots.  Ctx have increased since arrival and become more painful.  Pregnancy complicated by GDM.  No h/o HTN OB History    Gravida Para Term Preterm AB Living   2       1     SAB TAB Ectopic Multiple Live Births   1             Past Medical History:  Diagnosis Date  . Anxiety    panic attacks  . Asthma   . Dysrhythmia    pt states she's scheduled to waer halter monitor in July d/t "skipped beats"  . GERD (gastroesophageal reflux disease)    hx of " stomach ulcers"  . H. pylori infection    in middle school  . Headache(784.0)    migraines  . History of peptic ulcer   . History of syncope    2011--  W/ PALPITATIONS  --  ECHO NORMAL  . OCD (obsessive compulsive disorder)   . Ureteral calculi    BILATERAL   Past Surgical History:  Procedure Laterality Date  . CERVIX LESION DESTRUCTION    . CYSTOSCOPY W/ URETERAL STENT REMOVAL Bilateral 05/22/2013   Procedure: CYSTOSCOPY WITH STENT REMOVAL;  Surgeon: Alexis Frock, MD;  Location: [facility];  Service: Urology;  Laterality: Bilateral;  cysto and bilateral stent pull  . CYSTOSCOPY WITH RETROGRADE PYELOGRAM, URETEROSCOPY AND STENT PLACEMENT Bilateral 05/07/2013   Procedure: CYSTOSCOPY WITH RETROGRADE PYELOGRAM, URETEROSCOPY,  BASKET STONE EXTRACTIONS AND STENT PLACEMENT, BILATERAL;  Surgeon: Alexis Frock, MD;  Location: [facility];  Service: Urology;  Laterality: Bilateral;  . DILATION AND EVACUATION N/A 07/10/2013   Procedure: DILATATION AND EVACUATION;  Surgeon: Margarette Asal, MD;  Location: [facility];  Service: Gynecology;  Laterality: N/A;  . EXTRACORPOREAL SHOCK WAVE LITHOTRIPSY    . LAPAROSCOPY N/A 03/12/2014   Procedure: LAPAROSCOPY DIAGNOSTIC;  Surgeon: Margarette Asal, MD;  Location: [facility];  Service:  Gynecology;  Laterality: N/A;  . LEFT URETERSCOPIC STONE EXTRACTION  07-05-2006  . MASS EXCISION Left 09/08/2015   Procedure: EXCISION OF LEFT WRIST  MASS;  Surgeon: Charlotte Crumb, MD;  Location: [facility];  Service: Orthopedics;  Laterality: Left;  . TONSILLECTOMY AND ADENOIDECTOMY    . TRANSTHORACIC ECHOCARDIOGRAM  10-11-2010   NORMAL LVF/ EF 55-65%/ NORMAL VALVE'S  . TYMPANOSTOMY TUBE PLACEMENT    . WISDOM TOOTH EXTRACTION     Family History: family history includes Arthritis in her father, maternal grandmother, and mother; Breast cancer in her paternal grandmother; Cancer in her father, maternal aunt, maternal uncle, paternal aunt, and paternal grandfather; Deep vein thrombosis in her maternal grandmother; Diabetes in her maternal uncle and mother; Heart disease in her maternal grandfather, mother, and paternal aunt; Hyperlipidemia in her maternal aunt, maternal grandfather, maternal grandmother, maternal uncle, and mother; Hypertension in her maternal uncle and mother; Leukemia in her maternal aunt; Melanoma in her mother; Thyroid disease in her maternal uncle and mother. Social History:  reports that she has quit smoking. Her smoking use included Cigarettes. She has a 3.00 pack-year smoking history. She has never used smokeless tobacco. She reports that she does not drink alcohol or use drugs.     Maternal Diabetes: Yes:  Diabetes Type:  Diet controlled Genetic Screening:  No increased risk Maternal  Ultrasounds/Referrals: Normal Fetal Ultrasounds or other Referrals:  None Maternal Substance Abuse:  No Significant Maternal Medications:  sertraline Significant Maternal Lab Results:  None Other Comments:  None  ROS History Dilation: 1 Effacement (%): 70 Station: -2 Exam by:: S Earl RNC Blood pressure 139/89, pulse (!) 112, resp. rate 18, height 5\' 2"  (1.575 m), weight 171 lb (77.6 kg), SpO2 99 %. Exam Physical Exam  Gen - NAD Abd - gravid, firm uterus Ext -  NT PV - 1cm w/ moderate vb and clots Prenatal labs: ABO, Rh: A/Negative/-- (08/28 0000) Antibody: Negative (08/28 0000) Rubella: Immune (08/28 0000) RPR: Nonreactive (08/28 0000)  HBsAg: Negative (08/28 0000)  HIV: Non-reactive (08/28 0000)  GBS: Negative (03/30 0000)   Results for orders placed or performed during the hospital encounter of 02/11/17 (from the past 24 hour(s))  CBC     Status: Abnormal   Collection Time: 02/11/17  4:40 PM  Result Value Ref Range   WBC 9.5 4.0 - 10.5 K/uL   RBC 3.79 (L) 3.87 - 5.11 MIL/uL   Hemoglobin 11.9 (L) 12.0 - 15.0 g/dL   HCT 34.2 (L) 36.0 - 46.0 %   MCV 90.2 78.0 - 100.0 fL   MCH 31.4 26.0 - 34.0 pg   MCHC 34.8 30.0 - 36.0 g/dL   RDW 13.9 11.5 - 15.5 %   Platelets 210 150 - 400 K/uL  Type and screen     Status: None (Preliminary result)   Collection Time: 02/11/17  4:40 PM  Result Value Ref Range   ABO/RH(D) A NEG    Antibody Screen PENDING    Sample Expiration 02/14/2017   POCT fern test     Status: None   Collection Time: 02/11/17  5:08 PM  Result Value Ref Range   POCT Fern Test Negative = intact amniotic membranes    US shows vtx infant.  Anterior placenta - no abruption seen per verbal report  Assessment/Plan: Suspect placental abruption or LLP with labor rec primary c-section - r/b/a discussed, questions answered, informed consent    [name] 02/11/2017, 5:44 PM

## 2017-02-11 NOTE — Anesthesia Preprocedure Evaluation (Addendum)
Anesthesia Evaluation  Patient identified by MRN, date of birth, ID band Patient awake    Reviewed: Allergy & Precautions, H&P , Patient's Chart, lab work & pertinent test results  Airway Mallampati: II  TM Distance: >3 FB Neck ROM: full    Dental no notable dental hx.    Pulmonary former smoker,    Pulmonary exam normal breath sounds clear to auscultation       Cardiovascular Exercise Tolerance: Good  Rhythm:regular Rate:Normal     Neuro/Psych    GI/Hepatic   Endo/Other    Renal/GU      Musculoskeletal   Abdominal   Peds  Hematology   Anesthesia Other Findings   Reproductive/Obstetrics                             Anesthesia Physical Anesthesia Plan  ASA: II and emergent  Anesthesia Plan: Spinal   Post-op Pain Management:    Induction:   Airway Management Planned:   Additional Equipment:   Intra-op Plan:   Post-operative Plan:   Informed Consent: I have reviewed the patients History and Physical, chart, labs and discussed the procedure including the risks, benefits and alternatives for the proposed anesthesia with the patient or authorized representative who has indicated his/her understanding and acceptance.     Plan Discussed with:   Anesthesia Plan Comments: (  )        Anesthesia Quick Evaluation

## 2017-02-12 ENCOUNTER — Encounter (HOSPITAL_COMMUNITY): Payer: Self-pay | Admitting: Obstetrics and Gynecology

## 2017-02-12 ENCOUNTER — Inpatient Hospital Stay (HOSPITAL_COMMUNITY): Admission: RE | Admit: 2017-02-12 | Payer: 59 | Source: Ambulatory Visit

## 2017-02-12 LAB — CBC
HEMATOCRIT: 22.4 % — AB (ref 36.0–46.0)
Hemoglobin: 8 g/dL — ABNORMAL LOW (ref 12.0–15.0)
MCH: 31.9 pg (ref 26.0–34.0)
MCHC: 35.7 g/dL (ref 30.0–36.0)
MCV: 89.2 fL (ref 78.0–100.0)
Platelets: 151 10*3/uL (ref 150–400)
RBC: 2.51 MIL/uL — ABNORMAL LOW (ref 3.87–5.11)
RDW: 14.1 % (ref 11.5–15.5)
WBC: 10.8 10*3/uL — ABNORMAL HIGH (ref 4.0–10.5)

## 2017-02-12 LAB — RPR: RPR Ser Ql: NONREACTIVE

## 2017-02-12 MED ORDER — FERROUS SULFATE 325 (65 FE) MG PO TABS: 325.0000 mg | ORAL_TABLET | Freq: Two times a day (BID) | ORAL | Status: DC

## 2017-02-12 MED ORDER — PROMETHAZINE HCL 25 MG PO TABS
12.5000 mg | ORAL_TABLET | Freq: Four times a day (QID) | ORAL | Status: DC | PRN
  Administered 2017-02-12 (×3): 25 mg via ORAL
  Filled 2017-02-12 (×3): qty 1

## 2017-02-12 MED ORDER — RHO D IMMUNE GLOBULIN 1500 UNIT/2ML IJ SOSY
300.0000 ug | PREFILLED_SYRINGE | Freq: Once | INTRAMUSCULAR | Status: AC
  Administered 2017-02-12: 300 ug via INTRAVENOUS
  Filled 2017-02-12: qty 2

## 2017-02-12 MED ORDER — OXYCODONE HCL 5 MG/5ML PO SOLN
10.0000 mg | ORAL | Status: DC | PRN
  Administered 2017-02-12 – 2017-02-13 (×7): 10 mg via ORAL
  Filled 2017-02-12 (×8): qty 10

## 2017-02-12 MED ORDER — PROMETHAZINE HCL 6.25 MG/5ML PO SYRP
12.5000 mg | ORAL_SOLUTION | Freq: Four times a day (QID) | ORAL | Status: DC | PRN
  Filled 2017-02-12: qty 20

## 2017-02-12 MED ORDER — FERROUS SULFATE 300 (60 FE) MG/5ML PO SYRP
300.0000 mg | ORAL_SOLUTION | Freq: Two times a day (BID) | ORAL | Status: DC
  Administered 2017-02-12 – 2017-02-15 (×7): 300 mg via ORAL
  Filled 2017-02-12 (×7): qty 5

## 2017-02-12 MED ORDER — IBUPROFEN 100 MG/5ML PO SUSP
600.0000 mg | Freq: Four times a day (QID) | ORAL | Status: DC
Start: 2017-02-12 — End: 2017-02-15
  Administered 2017-02-12 – 2017-02-15 (×13): 600 mg via ORAL
  Filled 2017-02-12 (×14): qty 30

## 2017-02-12 MED ORDER — COMPLETENATE 29-1 MG PO CHEW
1.0000 | CHEWABLE_TABLET | Freq: Every day | ORAL | Status: DC
  Administered 2017-02-14: 1 via ORAL
  Filled 2017-02-12 (×4): qty 1

## 2017-02-12 MED ORDER — ACETAMINOPHEN 160 MG/5ML PO SOLN
1000.0000 mg | Freq: Four times a day (QID) | ORAL | Status: DC
  Administered 2017-02-12: 1000 mg via ORAL
  Filled 2017-02-12 (×2): qty 40.6

## 2017-02-12 NOTE — Lactation Note (Addendum)
This note was copied from a baby's chart. Lactation Consultation Note  Patient Name: Girl Lu Paradise [id number] Date: 02/12/2017 Reason for consult: Follow-up assessment   Follow-up consult at 64 hrs old; GA 39.6; BW 8 lbs, 2.3 lbs.  Mom is a P1.  GDM.  MSF.  Mom has DEBP in room.  Infant on q3hr feeds d/t blood sugars.   Infant has breastfed x1 (10 min) + attempt x3 (0-5 min) + formula (finger and bottle) x5 (9-22 ml); voids-2; stools-2; LS-6 by RN.   AD in room changing diaper and assisting mom when [facility] entered room.  Infant about to get a bottle of formula.   Offered assistance with breastfeeding but mom stated she wanted to just bottle feed formula while in hospital d/t breastfeedings causing cramping.  Stated she would breastfeed when she gets home. Educated that breastfeeding will cause some cramping for a few days and explained how the same hormone that causes the milk to let-down is same hormone that can cause cramping, but mom seemed determined that she only wanted to bottle feed in hospital.  Mom stated FOB's mom is a maternity nurse and can help her at home.   LC told mom that if she changed her mind and wanted help this evening, then to call for assistance.   RN stated she reviewed milk production and supply and demand with patient earlier in day.    Maternal Data    Feeding Feeding Type: Bottle Fed - Formula (given by staff; had to unwrap baby to wake) Nipple Type: Slow - flow  LATCH Score/Interventions                      Lactation Tools Discussed/Used     Consult Status Consult Status: PRN Follow-up type: In-patient    Merlene Laughter 02/12/2017, 7:14 PM

## 2017-02-12 NOTE — Plan of Care (Signed)
Problem: Activity: Goal: Will verbalize the importance of balancing activity with adequate rest periods Outcome: Completed/Met Date Met: 02/12/17 Encouraged increased ambulation today in room and in hallway. Discussed importance of ambulation for gas and soreness.

## 2017-02-12 NOTE — Progress Notes (Signed)
Subjective: Postpartum Day 1: Cesarean Delivery Patient reports incisional pain and tolerating PO.  Foley in.  Morphine PCA.  Objective: Vital signs in last 24 hours: Temp:  [98.2 F (36.8 C)-99.2 F (37.3 C)] 99.2 F (37.3 C) (04/02 0355) Pulse Rate:  [87-128] 106 (04/02 0355) Resp:  [13-27] 20 (04/02 0606) BP: (98-139)/(63-94) 116/67 (04/02 0355) SpO2:  [94 %-100 %] 98 % (04/02 0606) Weight:  [171 lb (77.6 kg)] 171 lb (77.6 kg) (04/01 1633)  Physical Exam:  General: alert, cooperative and appears stated age Lochia: appropriate Uterine Fundus: firm.  Approp lochia. Incision: healing well, no significant drainage, no dehiscence DVT Evaluation: No evidence of DVT seen on physical exam. Negative Homan's sign. No cords or calf tenderness.   Recent Labs  02/11/17 1640 02/12/17 0514  HGB 11.9* 8.0*  HCT 34.2* 22.4*    Assessment/Plan: Status post Cesarean section. Doing well postoperatively.  Continue current care. Acute blood loss anemia-start FeSO4 and continue PNV. Pain control-unable to swallow pills.  Will d/c PCA and transition to liquid narcotic.  Continue liquid ibuprofen.  [name], [place] 02/12/2017, 8:23 AM

## 2017-02-12 NOTE — Lactation Note (Signed)
This note was copied from a baby's chart. Lactation Consultation Note New mom had c/s w/pain and N/V post surgery. Mom has Dilaudid PCA. Baby sleepy had no interest in BF. Baby had formula from bottle 0100. Asked parents to call for assistance n BF before given next bottle.  Mom has large pendulum breast w/flat nipples. Areola not very compressible. Compresses inwards w/hand expression. Reverse pressure w/little effects. A few drops of colostrum expressed.  Hand pump to pre-pump nipple to erect. Fitted w/#20 NS. Baby latched and suckled a few times. A lot of teaching done during consult. Mom sleepy, she did ask questions. FOB in rm and supportive.  Stressed importance of positioning and support while BF. Rolled cloth under breast for support. Shells given to wear in afternoon.  Encouraged mom if baby hasn't cued before 3 hours to wake up and stimulate. Mom encouraged to feed baby 8-12 times/24 hours and with feeding cues. Mom shown how to use DEBP & how to disassemble, clean, & reassemble parts. Mom knows to pump q3h for 15-20 min.] [place] brochure given w/resources, support groups and [place] services. Patient Name: [name] Today's Date: 02/12/2017 Reason for consult: Initial assessment   Maternal Data Has patient been taught Hand Expression?: Yes Does the patient have breastfeeding experience prior to this delivery?: No  Feeding Feeding Type: Breast Fed Length of feed: 0 min  LATCH Score/Interventions Latch: Too sleepy or reluctant, no latch achieved, no sucking elicited. Intervention(s): Skin to skin;Teach feeding cues;Waking techniques  Audible Swallowing: None Intervention(s): Skin to skin;Hand expression  Type of Nipple: Flat Intervention(s): Double electric pump;Shells;Hand pump  Comfort (Breast/Nipple): Soft / non-tender     Hold (Positioning): Full assist, staff holds infant at breast Intervention(s): Breastfeeding basics reviewed;Support Pillows;Position  options;Skin to skin  LATCH Score: 3  Lactation Tools Discussed/Used Tools: Shells;Pump;Nipple Shields Nipple shield size: 20 Shell Type: Inverted Breast pump type: Double-Electric Breast Pump Pump Review: Setup, frequency, and cleaning;Milk Storage Initiated by:: Allayne Stack RN IBCLC Date initiated:: 02/12/17   Consult Status Consult Status: Follow-up Date: 02/12/17 Follow-up type: In-patient    [name], [name] 02/12/2017, 2:42 AM

## 2017-02-12 NOTE — Addendum Note (Signed)
Addendum  created 02/12/17 7445 by Raenette Rover, CRNA   Sign clinical note

## 2017-02-12 NOTE — Progress Notes (Signed)
Morphine PCA 2mg /ml wasted 50mg  in sink with Val Caldwell.   Bayard Beaver RN

## 2017-02-12 NOTE — Anesthesia Postprocedure Evaluation (Signed)
Anesthesia Post Note  Patient: [name]  Procedure(s) Performed: Procedure(s) (LRB): CESAREAN SECTION (N/A)  Patient location during evaluation: PACU Anesthesia Type: Spinal Level of consciousness: awake Pain management: satisfactory to patient Vital Signs Assessment: post-procedure vital signs reviewed and stable Respiratory status: spontaneous breathing Cardiovascular status: blood pressure returned to baseline Postop Assessment: no headache and spinal receding Anesthetic complications: no        Last Vitals:  Vitals:   02/12/17 0355 02/12/17 0606  BP: 116/67   Pulse: (!) 106   Resp: 20 20  Temp: 37.3 C     Last Pain:  Vitals:   02/12/17 0606  TempSrc:   PainSc: 6    Pain Goal:                 Riccardo Dubin

## 2017-02-12 NOTE — Plan of Care (Signed)
Problem: Activity: Goal: Ability to tolerate increased activity will improve Outcome: Progressing Patient no longer dizzy when out of bed.  Walking well on her own around room.  Encouraged to continue to increase activity today.  Problem: Urinary Elimination: Goal: Ability to reestablish a normal urinary elimination pattern will improve Outcome: Progressing Patient's foley catheter removed this AM at 0830.  Educated about staying hydrated and notifying RN when she needed to go to the bathroom.

## 2017-02-12 NOTE — Anesthesia Postprocedure Evaluation (Signed)
Anesthesia Post Note  Patient: [name]  Procedure(s) Performed: Procedure(s) (LRB): CESAREAN SECTION (N/A)  Patient location during evaluation: Mother Baby Anesthesia Type: Spinal Level of consciousness: awake, awake and alert, oriented and patient cooperative Pain management: pain level controlled Vital Signs Assessment: post-procedure vital signs reviewed and stable Respiratory status: spontaneous breathing, nonlabored ventilation, respiratory function stable and patient connected to nasal cannula oxygen Cardiovascular status: stable Postop Assessment: no headache, no backache, patient able to bend at knees and no signs of nausea or vomiting Anesthetic complications: no        Last Vitals:  Vitals:   02/12/17 0355 02/12/17 0606  BP: 116/67   Pulse: (!) 106   Resp: 20 20  Temp: 37.3 C     Last Pain:  Vitals:   02/12/17 0606  TempSrc:   PainSc: 6    Pain Goal:                 Audry Kauzlarich L

## 2017-02-13 LAB — RH IG WORKUP (INCLUDES ABO/RH)
ABO/RH(D): A NEG
Fetal Screen: NEGATIVE
Gestational Age(Wks): 40
UNIT DIVISION: 0

## 2017-02-13 LAB — CBC
HEMATOCRIT: 20.9 % — AB (ref 36.0–46.0)
HEMOGLOBIN: 7.3 g/dL — AB (ref 12.0–15.0)
MCH: 31.9 pg (ref 26.0–34.0)
MCHC: 34.9 g/dL (ref 30.0–36.0)
MCV: 91.3 fL (ref 78.0–100.0)
Platelets: 165 10*3/uL (ref 150–400)
RBC: 2.29 MIL/uL — AB (ref 3.87–5.11)
RDW: 14.7 % (ref 11.5–15.5)
WBC: 10 10*3/uL (ref 4.0–10.5)

## 2017-02-13 MED ORDER — SERTRALINE HCL 50 MG PO TABS
50.0000 mg | ORAL_TABLET | Freq: Every day | ORAL | Status: DC
  Administered 2017-02-13 – 2017-02-14 (×2): 50 mg via ORAL
  Filled 2017-02-13 (×3): qty 1

## 2017-02-13 NOTE — Plan of Care (Signed)
Problem: Activity: Goal: Ability to tolerate increased activity will improve Outcome: Progressing Encouraged patient to ambulate and walk halls several times and explained the need for movement; pt verbalized understanding but wished to wait until daytime to walk the halls  Problem: Nutritional: Goal: Mothers verbalization of comfort with breastfeeding process will improve Outcome: Progressing Patient desires to formula feed and possibly do some breastfeeding at home; educated on supply and demand for milk production

## 2017-02-13 NOTE — Progress Notes (Signed)
Subjective: Postpartum Day 2: Cesarean Delivery Patient reports incisional pain, tolerating PO, + flatus and no problems voiding.    Objective: Vital signs in last 24 hours: Temp:  [97.5 F (36.4 C)-99.5 F (37.5 C)] 97.6 F (36.4 C) (04/03 0548) Pulse Rate:  [101-115] 103 (04/03 0548) Resp:  [18] 18 (04/03 0548) BP: (116-123)/(59-72) 121/72 (04/03 0548) SpO2:  [95 %-99 %] 98 % (04/02 1600)  Physical Exam:  General: alert and cooperative Lochia: appropriate Uterine Fundus: firm Incision: healing well DVT Evaluation: No evidence of DVT seen on physical exam. Negative Homan's sign. No cords or calf tenderness. No significant calf/ankle edema. Denies dizziness with ambulation   Recent Labs  02/12/17 0514 02/13/17 0519  HGB 8.0* 7.3*  HCT 22.4* 20.9*    Assessment/Plan: Status post Cesarean section. Postoperative course complicated by anemia  Continue current care.  CURTIS,CAROL G 02/13/2017, 7:56 AM

## 2017-02-13 NOTE — Progress Notes (Signed)
[name] was referred for history of depression/anxiety. * Referral screened out by Clinical Social Worker because none of the following criteria appear to apply: ~ History of anxiety/depression during this pregnancy, or of post-partum depression. ~ Diagnosis of anxiety and/or depression within last 3 years OR * [name]'s symptoms currently being treated with medication and/or therapy. [name] is currently prescribed Zoloft.  [name] provided [name] with PPD checklist and a support group flyer for [facility].  Please contact the Clinical Social Worker if needs arise, or if [name] requests.  Laurey Arrow, MSW, LCSW Clinical Social Work [phone]'

## 2017-02-14 MED ORDER — AMOXICILLIN-POT CLAVULANATE 250-62.5 MG/5ML PO SUSR
500.0000 mg | Freq: Three times a day (TID) | ORAL | Status: DC
  Administered 2017-02-14 – 2017-02-15 (×3): 500 mg via ORAL
  Filled 2017-02-14 (×4): qty 10

## 2017-02-14 MED ORDER — OXYCODONE HCL 5 MG/5ML PO SOLN: 5.0000 mg | ORAL | Status: DC | PRN

## 2017-02-14 MED ORDER — HYDROCORTISONE 1 % EX CREA
TOPICAL_CREAM | Freq: Three times a day (TID) | CUTANEOUS | Status: DC
  Administered 2017-02-14 – 2017-02-15 (×2): via TOPICAL
  Filled 2017-02-14: qty 28

## 2017-02-14 NOTE — Plan of Care (Signed)
Problem: Skin Integrity: Goal: Risk for impaired skin integrity will decrease Outcome: Progressing Contacted on-call Dr Corinna Capra to inform of new welts/hives on pt's abdomen (pt also noted her legs were itching as well).

## 2017-02-14 NOTE — Progress Notes (Signed)
Subjective: Postpartum Day 3: Cesarean Delivery Patient reports incisional pain, tolerating PO, + flatus and no problems voiding.   Ambulating in hall , occasional dizziness Objective: Vital signs in last 24 hours: Temp:  [98 F (36.7 C)-98.1 F (36.7 C)] 98 F (36.7 C) (04/04 0629) Pulse Rate:  [85-116] 85 (04/04 0629) Resp:  [17] 17 (04/04 0629) BP: (125-131)/(76-79) 131/76 (04/04 5929)  Physical Exam:  General: alert and cooperative Lochia: appropriate Uterine Fundus: firm Incision: healing well, erythema noted superior to incision, tender DVT Evaluation: No evidence of DVT seen on physical exam. Negative Homan's sign. No cords or calf tenderness. No significant calf/ankle edema. Fine micropapular rash noted no abdomen  Recent Labs  02/12/17 0514 02/13/17 0519  HGB 8.0* 7.3*  HCT 22.4* 20.9*    Assessment/Plan: Status post Cesarean section. Postoperative course complicated by  Anemia and cellulitis augmentin ordred  Topical cortisone to abd.  CURTIS,CAROL G 02/14/2017, 8:27 AM

## 2017-02-14 NOTE — Lactation Note (Signed)
This note was copied from a baby's chart. Lactation Consultation Note  Patient Name: [name] Today's Date: 02/14/2017   Visited with Mom, baby 24 hrs old.  Mom has decided to do exclusive pumping and bottle feeding.  Mom has not started pumping, and breasts are filling.  Set up DEBP and Mom to pump 8-12 times per 24 hrs.  Encouraged Mom to place baby STS as much as possible. Mom's discharge delayed due to anemia and cellulitis of incision.   Mom to call prn for assistance.  Has a Medela PIS at home. Lactation to follow up daily.    Broadus John 02/14/2017, 12:28 PM

## 2017-02-14 NOTE — Plan of Care (Signed)
Problem: Skin Integrity: Goal: Risk for impaired skin integrity will decrease Outcome: Progressing Pt noted a rash on her abdomen, which she had also previously noted in pregnancy. Instructed pt to inform nurse if rash increased in redness, swelling, or became painful

## 2017-02-15 ENCOUNTER — Encounter (HOSPITAL_COMMUNITY): Payer: Self-pay | Admitting: Lactation Services

## 2017-02-15 LAB — TYPE AND SCREEN
ABO/RH(D): A NEG
ANTIBODY SCREEN: POSITIVE
DAT, IgG: NEGATIVE
UNIT DIVISION: 0
Unit division: 0

## 2017-02-15 LAB — BPAM RBC
Blood Product Expiration Date: 201804272359
Blood Product Expiration Date: 201804282359
Unit Type and Rh: 600
Unit Type and Rh: 600

## 2017-02-15 MED ORDER — FERROUS SULFATE 300 (60 FE) MG/5ML PO SYRP: 300.0000 mg | ORAL_SOLUTION | Freq: Two times a day (BID) | ORAL | 3 refills | Status: DC

## 2017-02-15 MED ORDER — ACETAMINOPHEN 160 MG/5ML PO SOLN: 1000.0000 mg | Freq: Four times a day (QID) | ORAL | 1 refills | Status: DC | PRN

## 2017-02-15 MED ORDER — AMOXICILLIN-POT CLAVULANATE 250-62.5 MG/5ML PO SUSR: 500.0000 mg | Freq: Three times a day (TID) | ORAL | 0 refills | Status: DC

## 2017-02-15 MED ORDER — IBUPROFEN 100 MG/5ML PO SUSP: 600.0000 mg | Freq: Four times a day (QID) | ORAL | 1 refills | Status: DC

## 2017-02-15 MED ORDER — SERTRALINE HCL 50 MG PO TABS: 50.0000 mg | ORAL_TABLET | Freq: Every day | ORAL | 6 refills | Status: DC

## 2017-02-15 MED ORDER — PANTOPRAZOLE SODIUM 40 MG PO TBEC: 40.0000 mg | DELAYED_RELEASE_TABLET | Freq: Every day | ORAL | 0 refills | Status: DC

## 2017-02-15 NOTE — Plan of Care (Signed)
Problem: Coping: Goal: Ability to identify and utilize available resources and services will improve Outcome: Completed/Met Date Met: 02/15/17 Patient has been informed of outpatient lactation community support and services.

## 2017-02-15 NOTE — Plan of Care (Signed)
Problem: Nutritional: Goal: Mothers verbalization of comfort with breastfeeding process will improve Outcome: Completed/Met Date Met: 02/15/17 Patient is pumping and giving expressed breast milk. She breastfeeds occasionally and prefers to have her family member support her with feedings. Assistance was offered and declined. Lactation Consultants have seen patient.

## 2017-02-15 NOTE — Discharge Summary (Signed)
Obstetric Discharge Summary Reason for Admission: bleeding Prenatal Procedures: ultrasound Intrapartum Procedures: cesarean: low cervical, transverse Postpartum Procedures: none Complications-Operative and Postpartum: none Hemoglobin  Date Value Ref Range Status  02/13/2017 7.3 (L) 12.0 - 15.0 g/dL Final   HGB  Date Value Ref Range Status  07/24/2007 14.7 11.6 - 15.9 g/dL Final   HCT  Date Value Ref Range Status  02/13/2017 20.9 (L) 36.0 - 46.0 % Final  07/24/2007 41.8 34.8 - 46.6 % Final    Physical Exam:  General: alert and cooperative Lochia: appropriate Uterine Fundus: firm Incision: healing well, erythema noted superior to incision DVT Evaluation: No evidence of DVT seen on physical exam. Negative Homan's sign. No cords or calf tenderness. No significant calf/ankle edema.  Discharge Diagnoses: Term Pregnancy-delivered and abruption  Discharge Information: Date: 02/15/2017 Activity: pelvic rest Diet: routine Medications: PNV, Ibuprofen, Percocet and augmentin Condition: stable Instructions: refer to practice specific booklet Discharge to: home   Newborn Data: Live born female  Birth Weight: 8 lb 2.3 oz (3695 g) APGAR: 8, 9  Home with mother.  [name] G 02/15/2017, 8:41 AM

## 2017-02-15 NOTE — Plan of Care (Signed)
Problem: Pain Management: Goal: General experience of comfort will improve and pain level will decrease Outcome: Adequate for Discharge Mother reports relief with pain with medications and rest.

## 2017-02-15 NOTE — Lactation Note (Signed)
This note was copied from a baby's chart. Lactation Consultation Note; Mom is pumping and bottle feeding EBM- obtains 45-60 ml. Has Medela pump for home. Reviewed our phone number if she wants to make OP appointment for assist with latch. . 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 No questions at present. To call prn  Patient Name: [name] Today's Date: 02/15/2017     Maternal Data    Feeding Feeding Type: Breast Fed  LATCH Score/Interventions                      Lactation Tools Discussed/Used     Consult Status      [name] 02/15/2017, 10:34 AM

## 2017-03-28 DIAGNOSIS — Z1389 Encounter for screening for other disorder: Secondary | ICD-10-CM | POA: Diagnosis not present

## 2017-05-25 NOTE — Addendum Note (Signed)
Addendum  created 05/25/17 1030 by Lyndle Herrlich, MD   Sign clinical note

## 2017-05-25 NOTE — Anesthesia Postprocedure Evaluation (Signed)
Anesthesia Post Note  Patient: [name]  Procedure(s) Performed: Procedure(s) (LRB): CESAREAN SECTION (N/A)     Anesthesia Post Evaluation  Last Vitals:  Vitals:   02/14/17 1900 02/15/17 0636  BP: 126/73 120/70  Pulse: 91 94  Resp: 18 18  Temp: 36.4 C 36.6 C    Last Pain:  Vitals:   02/15/17 0940  TempSrc:   PainSc: 3                  Ulani Degrasse EDWARD

## 2017-08-27 DIAGNOSIS — R197 Diarrhea, unspecified: Secondary | ICD-10-CM | POA: Diagnosis not present

## 2017-08-27 DIAGNOSIS — Z87442 Personal history of urinary calculi: Secondary | ICD-10-CM | POA: Diagnosis not present

## 2017-08-27 DIAGNOSIS — R1013 Epigastric pain: Secondary | ICD-10-CM | POA: Diagnosis not present

## 2018-04-16 DIAGNOSIS — Z01419 Encounter for gynecological examination (general) (routine) without abnormal findings: Secondary | ICD-10-CM | POA: Diagnosis not present

## 2019-04-21 DIAGNOSIS — F419 Anxiety disorder, unspecified: Secondary | ICD-10-CM | POA: Insufficient documentation

## 2019-04-21 DIAGNOSIS — F32A Depression, unspecified: Secondary | ICD-10-CM | POA: Insufficient documentation

## 2019-04-21 DIAGNOSIS — G43909 Migraine, unspecified, not intractable, without status migrainosus: Secondary | ICD-10-CM | POA: Insufficient documentation

## 2020-11-30 DIAGNOSIS — J329 Chronic sinusitis, unspecified: Secondary | ICD-10-CM | POA: Diagnosis not present

## 2020-11-30 DIAGNOSIS — M545 Low back pain, unspecified: Secondary | ICD-10-CM | POA: Diagnosis not present

## 2020-11-30 DIAGNOSIS — G8929 Other chronic pain: Secondary | ICD-10-CM | POA: Diagnosis not present

## 2020-12-16 DIAGNOSIS — R0981 Nasal congestion: Secondary | ICD-10-CM | POA: Diagnosis not present

## 2021-02-02 DIAGNOSIS — J019 Acute sinusitis, unspecified: Secondary | ICD-10-CM | POA: Diagnosis not present

## 2021-02-17 ENCOUNTER — Emergency Department (HOSPITAL_COMMUNITY): Payer: BC Managed Care – PPO

## 2021-02-17 ENCOUNTER — Encounter (HOSPITAL_COMMUNITY): Payer: Self-pay

## 2021-02-17 ENCOUNTER — Emergency Department (HOSPITAL_COMMUNITY)
Admission: EM | Admit: 2021-02-17 | Discharge: 2021-02-17 | Disposition: A | Discharge status: Home / Self Care | Payer: BC Managed Care – PPO | Attending: Emergency Medicine | Admitting: Emergency Medicine

## 2021-02-17 ENCOUNTER — Other Ambulatory Visit: Payer: Self-pay

## 2021-02-17 DIAGNOSIS — R1031 Right lower quadrant pain: Secondary | ICD-10-CM | POA: Diagnosis not present

## 2021-02-17 DIAGNOSIS — R109 Unspecified abdominal pain: Secondary | ICD-10-CM | POA: Diagnosis not present

## 2021-02-17 DIAGNOSIS — R079 Chest pain, unspecified: Secondary | ICD-10-CM | POA: Diagnosis not present

## 2021-02-17 DIAGNOSIS — R000 Tachycardia, unspecified: Secondary | ICD-10-CM | POA: Diagnosis not present

## 2021-02-17 DIAGNOSIS — N12 Tubulo-interstitial nephritis, not specified as acute or chronic: Secondary | ICD-10-CM

## 2021-02-17 DIAGNOSIS — K439 Ventral hernia without obstruction or gangrene: Secondary | ICD-10-CM | POA: Diagnosis not present

## 2021-02-17 DIAGNOSIS — M549 Dorsalgia, unspecified: Secondary | ICD-10-CM | POA: Diagnosis not present

## 2021-02-17 DIAGNOSIS — N200 Calculus of kidney: Secondary | ICD-10-CM | POA: Diagnosis not present

## 2021-02-17 DIAGNOSIS — K469 Unspecified abdominal hernia without obstruction or gangrene: Secondary | ICD-10-CM | POA: Diagnosis not present

## 2021-02-17 DIAGNOSIS — Z87891 Personal history of nicotine dependence: Secondary | ICD-10-CM | POA: Insufficient documentation

## 2021-02-17 DIAGNOSIS — Z6826 Body mass index (BMI) 26.0-26.9, adult: Secondary | ICD-10-CM | POA: Diagnosis not present

## 2021-02-17 DIAGNOSIS — K429 Umbilical hernia without obstruction or gangrene: Secondary | ICD-10-CM | POA: Diagnosis not present

## 2021-02-17 DIAGNOSIS — J45909 Unspecified asthma, uncomplicated: Secondary | ICD-10-CM | POA: Diagnosis not present

## 2021-02-17 DIAGNOSIS — K529 Noninfective gastroenteritis and colitis, unspecified: Secondary | ICD-10-CM | POA: Diagnosis not present

## 2021-02-17 DIAGNOSIS — R0602 Shortness of breath: Secondary | ICD-10-CM | POA: Diagnosis not present

## 2021-02-17 DIAGNOSIS — R1110 Vomiting, unspecified: Secondary | ICD-10-CM | POA: Diagnosis not present

## 2021-02-17 HISTORY — DX: Disorder of kidney and ureter, unspecified: N28.9

## 2021-02-17 LAB — BASIC METABOLIC PANEL WITH GFR
Anion gap: 13 (ref 5–15)
BUN: 15 mg/dL (ref 6–20)
CO2: 20 mmol/L — ABNORMAL LOW (ref 22–32)
Calcium: 8.7 mg/dL — ABNORMAL LOW (ref 8.9–10.3)
Chloride: 108 mmol/L (ref 98–111)
Creatinine, Ser: 0.59 mg/dL (ref 0.44–1.00)
GFR, Estimated: >60 mL/min
Glucose, Bld: 119 mg/dL — ABNORMAL HIGH (ref 70–99)
Potassium: 3.7 mmol/L (ref 3.5–5.1)
Sodium: 141 mmol/L (ref 135–145)

## 2021-02-17 LAB — URINALYSIS, ROUTINE W REFLEX MICROSCOPIC
Bilirubin Urine: NEGATIVE
Glucose, UA: NEGATIVE mg/dL
Hgb urine dipstick: NEGATIVE
Ketones, ur: 5 mg/dL — AB
Nitrite: NEGATIVE
Protein, ur: NEGATIVE mg/dL
Specific Gravity, Urine: >1.046 — ABNORMAL HIGH (ref 1.005–1.030)
pH: 5 (ref 5.0–8.0)

## 2021-02-17 LAB — CBC WITH DIFFERENTIAL/PLATELET
Abs Immature Granulocytes: 0.06 K/uL (ref 0.00–0.07)
Basophils Absolute: 0 K/uL (ref 0.0–0.1)
Basophils Relative: 0 %
Eosinophils Absolute: 0.1 K/uL (ref 0.0–0.5)
Eosinophils Relative: 0 %
HCT: 44 % (ref 36.0–46.0)
Hemoglobin: 14.7 g/dL (ref 12.0–15.0)
Immature Granulocytes: 1 %
Lymphocytes Relative: 4 %
Lymphs Abs: 0.5 K/uL — ABNORMAL LOW (ref 0.7–4.0)
MCH: 29.5 pg (ref 26.0–34.0)
MCHC: 33.4 g/dL (ref 30.0–36.0)
MCV: 88.2 fL (ref 80.0–100.0)
Monocytes Absolute: 0.4 K/uL (ref 0.1–1.0)
Monocytes Relative: 3 %
Neutro Abs: 11.7 K/uL — ABNORMAL HIGH (ref 1.7–7.7)
Neutrophils Relative %: 92 %
Platelets: 276 K/uL (ref 150–400)
RBC: 4.99 MIL/uL (ref 3.87–5.11)
RDW: 12.2 % (ref 11.5–15.5)
WBC: 12.8 K/uL — ABNORMAL HIGH (ref 4.0–10.5)
nRBC: 0 % (ref 0.0–0.2)

## 2021-02-17 LAB — I-STAT BETA HCG BLOOD, ED (MC, WL, AP ONLY): I-stat hCG, quantitative: <5 m[IU]/mL (ref <5)

## 2021-02-17 MED ORDER — IOHEXOL 350 MG/ML SOLN
100.0000 mL | Freq: Once | INTRAVENOUS | Status: AC | PRN
  Administered 2021-02-17: 55 mL via INTRAVENOUS

## 2021-02-17 MED ORDER — PROMETHAZINE HCL 25 MG PO TABS: 25.0000 mg | ORAL_TABLET | Freq: Four times a day (QID) | ORAL | 0 refills | Status: AC | PRN

## 2021-02-17 MED ORDER — CEFPODOXIME PROXETIL 200 MG PO TABS: 200.0000 mg | ORAL_TABLET | Freq: Two times a day (BID) | ORAL | 0 refills | Status: AC

## 2021-02-17 MED ORDER — SODIUM CHLORIDE 0.9 % IV BOLUS
1000.0000 mL | Freq: Once | INTRAVENOUS | Status: AC
  Administered 2021-02-17: 1000 mL via INTRAVENOUS

## 2021-02-17 MED ORDER — PROMETHAZINE HCL 25 MG PO TABS
25.0000 mg | ORAL_TABLET | Freq: Once | ORAL | Status: AC
  Administered 2021-02-17: 25 mg via ORAL
  Filled 2021-02-17: qty 1

## 2021-02-17 MED ORDER — ONDANSETRON HCL 4 MG/2ML IJ SOLN
4.0000 mg | Freq: Once | INTRAMUSCULAR | Status: AC
  Administered 2021-02-17: 4 mg via INTRAVENOUS
  Filled 2021-02-17: qty 2

## 2021-02-17 MED ORDER — SODIUM CHLORIDE 0.9 % IV SOLN
1.0000 g | Freq: Once | INTRAVENOUS | Status: AC
  Administered 2021-02-17: 1 g via INTRAVENOUS
  Filled 2021-02-17: qty 10

## 2021-02-17 MED ORDER — METOCLOPRAMIDE HCL 5 MG/ML IJ SOLN
5.0000 mg | Freq: Once | INTRAMUSCULAR | Status: DC
  Filled 2021-02-17: qty 2

## 2021-02-17 MED ORDER — SODIUM CHLORIDE 0.9 % IV BOLUS
500.0000 mL | Freq: Once | INTRAVENOUS | Status: AC
  Administered 2021-02-17: 500 mL via INTRAVENOUS

## 2021-02-17 MED ORDER — SODIUM CHLORIDE 0.9 % IV SOLN
12.5000 mg | Freq: Once | INTRAVENOUS | Status: DC
  Filled 2021-02-17: qty 0.5

## 2021-02-17 NOTE — Discharge Instructions (Signed)
You had a few kidney stones in your left kidney but none of these were obstructing.  These do not appear to be the cause of your pain. You may have a kidney infection.  I will place you on an antibiotic and have sent your urine for culture if you need to be on a different antibiotic to call you and let you know. Make sure you are drinking plenty of fluids and take the Phenergan as needed to help with your nausea. She will advance her diet as tolerated.  Avoid heavy, fatty foods, acidic foods or spicy foods as well as dairy for the next few days until your symptoms start to improve. Return to the ER if you start to experience worsening abdominal pain, fever, inability to tolerate anything by mouth, worsening chest pain or shortness of breath.

## 2021-02-17 NOTE — ED Provider Notes (Signed)
[facility] DEPT Provider Note   CSN: [id number] Arrival date & time: 02/17/21  1203     History Chief Complaint  Patient presents with  . Flank Pain  . Emesis    [name] is a 35 y.o. female with a past medical history of anxiety, GERD presenting to the ED with a chief complaint of right-sided flank pain, right lower abdominal pain, chest pain, shortness of breath.  States that she began having back pain over the past few days but it worsened today.  Reports sharp pain that starts in her back and radiates down.  Also noticed that she was having chest pain since yesterday.  Pain is pleuritic in nature.  Reports associated shortness of breath.  She was seen and evaluated at urgent care and was sent to the ER for possible kidney stone.  She states that the flank pain does feel similar to her prior kidney stones which have required procedure in the past.  Denies any fevers.  No cough, dysuria, vaginal bleeding, vaginal discharge, diarrhea.  She does report persistent vomiting.  She has not tried medications to help with her symptoms.  She denies possibility of pregnancy as she is on OCPs  HPI     Past Medical History:  Diagnosis Date  . Anxiety    panic attacks  . Asthma   . Dysrhythmia    pt states she's scheduled to waer halter monitor in July d/t "skipped beats"  . GERD (gastroesophageal reflux disease)    hx of " stomach ulcers"  . H. pylori infection    in middle school  . Headache(784.0)    migraines  . History of peptic ulcer   . History of syncope    2011--  W/ PALPITATIONS  --  ECHO NORMAL  . OCD (obsessive compulsive disorder)   . Renal disorder   . Ureteral calculi    BILATERAL    Patient Active Problem List   Diagnosis Date Noted  . Placental abruption 02/11/2017  . Missed abortion 07/10/2013  . ASTHMA 09/27/2010  . 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 OVARIAN CYST 09/27/2010  . SYNCOPE 09/27/2010  . DIZZINESS 09/27/2010  .  PALPITATIONS 09/27/2010    Past Surgical History:  Procedure Laterality Date  . CERVIX LESION DESTRUCTION    . CESAREAN SECTION N/A 02/11/2017   Procedure: CESAREAN SECTION;  Surgeon: Marylynn Pearson, MD;  Location: [facility];  Service: Obstetrics;  Laterality: N/A;  . CYSTOSCOPY W/ URETERAL STENT REMOVAL Bilateral 05/22/2013   Procedure: CYSTOSCOPY WITH STENT REMOVAL;  Surgeon: Alexis Frock, MD;  Location: [facility];  Service: Urology;  Laterality: Bilateral;  cysto and bilateral stent pull  . CYSTOSCOPY WITH RETROGRADE PYELOGRAM, URETEROSCOPY AND STENT PLACEMENT Bilateral 05/07/2013   Procedure: CYSTOSCOPY WITH RETROGRADE PYELOGRAM, URETEROSCOPY,  BASKET STONE EXTRACTIONS AND STENT PLACEMENT, BILATERAL;  Surgeon: Alexis Frock, MD;  Location: [facility];  Service: Urology;  Laterality: Bilateral;  . DILATION AND EVACUATION N/A 07/10/2013   Procedure: DILATATION AND EVACUATION;  Surgeon: Margarette Asal, MD;  Location: [facility];  Service: Gynecology;  Laterality: N/A;  . EXTRACORPOREAL SHOCK WAVE LITHOTRIPSY    . LAPAROSCOPY N/A 03/12/2014   Procedure: LAPAROSCOPY DIAGNOSTIC;  Surgeon: Margarette Asal, MD;  Location: [facility];  Service: Gynecology;  Laterality: N/A;  . LEFT URETERSCOPIC STONE EXTRACTION  07-05-2006  . MASS EXCISION Left 09/08/2015   Procedure: EXCISION OF LEFT WRIST  MASS;  Surgeon: Charlotte Crumb, MD;  Location: [facility];  Service: Orthopedics;  Laterality: Left;  . TONSILLECTOMY AND ADENOIDECTOMY    . TRANSTHORACIC ECHOCARDIOGRAM  10-11-2010   NORMAL LVF/ EF 55-65%/ NORMAL VALVE'S  . TYMPANOSTOMY TUBE PLACEMENT    . WISDOM TOOTH EXTRACTION       OB History    Gravida  2   Para      Term      Preterm      AB  1   Living        SAB  1   IAB      Ectopic      Multiple      Live Births              Family History  Problem Relation Age of Onset  . Diabetes Mother   . Arthritis Mother    . Hypertension Mother   . Heart disease Mother   . Hyperlipidemia Mother   . Thyroid disease Mother   . Melanoma Mother   . Cancer Father   . Arthritis Father   . Cancer Maternal Aunt   . Leukemia Maternal Aunt   . Hyperlipidemia Maternal Aunt   . Cancer Maternal Uncle   . Hyperlipidemia Maternal Uncle   . Hypertension Maternal Uncle   . Diabetes Maternal Uncle   . Thyroid disease Maternal Uncle   . Cancer Paternal Aunt   . Heart disease Paternal Aunt   . Arthritis Maternal Grandmother   . Hyperlipidemia Maternal Grandmother   . Deep vein thrombosis Maternal Grandmother   . Heart disease Maternal Grandfather   . Hyperlipidemia Maternal Grandfather   . Cancer Paternal Grandfather   . Breast cancer Paternal Grandmother   . Colon cancer Neg Hx   . Stomach cancer Neg Hx     Social History   Tobacco Use  . Smoking status: Former Smoker    Packs/day: 0.50    Years: 6.00    Pack years: 3.00    Types: Cigarettes  . Smokeless tobacco: Never Used  Vaping Use  . Vaping Use: Never used  Substance Use Topics  . Alcohol use: No  . Drug use: No    Home Medications Prior to Admission medications   Medication Sig Start Date End Date Taking? Authorizing Provider  cefpodoxime (VANTIN) 200 MG tablet Take 1 tablet (200 mg total) by mouth 2 (two) times daily for 10 days. 02/17/21 02/27/21 Yes Mikeya Tomasetti, PA-C  cetirizine (ZYRTEC) 10 MG tablet Take 10 mg by mouth daily. morning   Yes [provider]  cyclobenzaprine (FLEXERIL) 10 MG tablet Take 1 tablet by mouth daily as needed for muscle spasms. 01/26/21  Yes [provider]  ibuprofen (ADVIL) 200 MG tablet Take 800 mg by mouth every 6 (six) hours as needed for mild pain.   Yes [provider]  loratadine (CLARITIN) 10 MG tablet Take 10 mg by mouth daily. night   Yes [provider]  Norethindrone Acetate-Ethinyl Estrad-FE (LARIN 24 FE) 1-20 MG-MCG(24) tablet Take 1 tablet by mouth daily.   Yes  [provider]  pantoprazole (PROTONIX) 40 MG tablet Take 1 tablet (40 mg total) by mouth daily. Patient taking differently: Take 40 mg by mouth 2 (two) times daily. 02/15/17  Yes Juanda Chance, NP  promethazine (PHENERGAN) 25 MG tablet Take 1 tablet (25 mg total) by mouth every 6 (six) hours as needed for nausea or vomiting. 02/17/21  Yes Phoenyx Melka, PA-C  acetaminophen (TYLENOL) 160 MG/5ML solution Take 31.3 mLs (1,000 mg  total) by mouth every 6 (six) hours as needed for moderate pain. Patient not taking: No sig reported 02/15/17   Juanda Chance, NP  ferrous sulfate 300 (60 Fe) MG/5ML syrup Take 5 mLs (300 mg total) by mouth 2 (two) times daily with a meal. Patient not taking: No sig reported 02/15/17   Juanda Chance, NP  ibuprofen (ADVIL,MOTRIN) 100 MG/5ML suspension Take 30 mLs (600 mg total) by mouth every 6 (six) hours. Patient not taking: No sig reported 02/15/17   Juanda Chance, NP  sertraline (ZOLOFT) 50 MG tablet Take 1 tablet (50 mg total) by mouth daily. Patient not taking: No sig reported 02/15/17   Juanda Chance, NP    Allergies    Dilaudid [hydromorphone hcl], Xanax [alprazolam], and Iodine  Review of Systems   Review of Systems  Constitutional: Negative for appetite change, chills and fever.  HENT: Negative for ear pain, rhinorrhea, sneezing and sore throat.   Eyes: Negative for photophobia and visual disturbance.  Respiratory: Positive for shortness of breath. Negative for cough, chest tightness and wheezing.   Cardiovascular: Positive for chest pain. Negative for palpitations.  Gastrointestinal: Positive for nausea and vomiting. Negative for abdominal pain, blood in stool, constipation and diarrhea.  Genitourinary: Positive for flank pain. Negative for dysuria, hematuria and urgency.  Musculoskeletal: Negative for myalgias.  Skin: Negative for rash.  Neurological: Negative for dizziness, weakness and light-headedness.    Physical Exam Updated Vital Signs BP 109/79    Pulse (!) 106   Temp 98.3 F (36.8 C) (Oral)   Resp (!) 22   Ht 5\' 2"  (1.575 m)   Wt 65.8 kg   LMP 02/17/2021   SpO2 100%   BMI 26.52 kg/m   Physical Exam Vitals and nursing note reviewed.  Constitutional:      General: She is not in acute distress.    Appearance: She is well-developed.     Comments: Intermittently tearful.  HENT:     Head: Normocephalic and atraumatic.     Nose: Nose normal.  Eyes:     General: No scleral icterus.       Right eye: No discharge.        Left eye: No discharge.     Conjunctiva/sclera: Conjunctivae normal.  Cardiovascular:     Rate and Rhythm: Regular rhythm. Tachycardia present.     Heart sounds: Normal heart sounds. No murmur heard. No friction rub. No gallop.   Pulmonary:     Effort: Pulmonary effort is normal. No respiratory distress.     Breath sounds: Normal breath sounds.  Abdominal:     General: Bowel sounds are normal. There is no distension.     Palpations: Abdomen is soft.     Tenderness: There is abdominal tenderness (Right flank). There is right CVA tenderness. There is no guarding.  Musculoskeletal:        General: Normal range of motion.     Cervical back: Normal range of motion and neck supple.     Right lower leg: No edema.     Left lower leg: No edema.     Comments: No lower extremity edema, erythema or calf tenderness bilaterally.  Skin:    General: Skin is warm and dry.     Findings: No rash.  Neurological:     Mental Status: She is alert.     Motor: No abnormal muscle tone.     Coordination: Coordination normal.     ED Results / Procedures / Treatments   Labs (all  labs ordered are listed, but only abnormal results are displayed) Labs Reviewed  URINALYSIS, ROUTINE W REFLEX MICROSCOPIC - Abnormal; Notable for the following components:      Result Value   Specific Gravity, Urine >1.046 (*)    Ketones, ur 5 (*)    Leukocytes,Ua SMALL (*)    Bacteria, UA RARE (*)    All other components within normal limits   BASIC METABOLIC PANEL - Abnormal; Notable for the following components:   CO2 20 (*)    Glucose, Bld 119 (*)    Calcium 8.7 (*)    All other components within normal limits  CBC WITH DIFFERENTIAL/PLATELET - Abnormal; Notable for the following components:   WBC 12.8 (*)    Neutro Abs 11.7 (*)    Lymphs Abs 0.5 (*)    All other components within normal limits  URINE CULTURE  I-STAT BETA HCG BLOOD, ED ([facility], [facility], [facility] ONLY)    EKG EKG Interpretation  Date/Time:  Thursday February 17 2021 13:03:16 EDT Ventricular Rate:  105 PR Interval:  129 QRS Duration: 84 QT Interval:  339 QTC Calculation: 448 R Axis:   58 Text Interpretation: Sinus tachycardia No previous tracing Confirmed by Lajean Saver [phone]) on 02/17/2021 1:14:42 PM   Radiology CT Angio Chest PE W/Cm &/Or Wo Cm  Result Date: 02/17/2021 CLINICAL DATA:  "patient c/o right flank pain and right lower abdominal pain that radiates into the pelvic area. Patient reports a history of kidney stones. Patient also has had vomiting today." states this feels different than prior kidney stone pains.Pt complaining of mid sternal CP. Started at [facility]: CT ANGIOGRAPHY CHEST WITH CONTRAST TECHNIQUE: Multidetector CT imaging of the chest was performed using the standard protocol during bolus administration of intravenous contrast. Multiplanar CT image reconstructions and MIPs were obtained to evaluate the vascular anatomy. CONTRAST:  82mL OMNIPAQUE IOHEXOL 350 MG/ML SOLN COMPARISON:  None. FINDINGS: Cardiovascular: Pulmonary arteries satisfactorily opacified. There is no evidence of a pulmonary embolism. Mediastinum/Nodes: Heart normal in size and configuration. No pericardial effusion. Normal great vessels. Lungs/Pleura: Lungs are clear. No pleural effusion or pneumothorax. Upper Abdomen: No abnormality of the visualized upper abdomen. Musculoskeletal: No chest wall abnormality. No acute or significant osseous findings. Review of the MIP images confirms  the above findings. IMPRESSION: 1. Normal exam.  No evidence of a pulmonary embolism. Electronically Signed   By: Lajean Manes M.D.   On: 02/17/2021 16:08   DG Chest Portable 1 View  Result Date: 02/17/2021 CLINICAL DATA:  Short of breath and chest pain EXAM: PORTABLE CHEST 1 VIEW COMPARISON:  None. FINDINGS: The heart size and mediastinal contours are within normal limits. Both lungs are clear. The visualized skeletal structures are unremarkable. IMPRESSION: No active disease. Electronically Signed   By: Franchot Gallo M.D.   On: 02/17/2021 13:41   CT Renal Stone Study  Result Date: 02/17/2021 CLINICAL DATA:  35 year old female with right flank pain. Concern for kidney stone. EXAM: CT ABDOMEN AND PELVIS WITHOUT CONTRAST TECHNIQUE: Multidetector CT imaging of the abdomen and pelvis was performed following the standard protocol without IV contrast. COMPARISON:  CT abdomen pelvis dated 12/12/2012. FINDINGS: Evaluation of this exam is limited in the absence of intravenous contrast. Lower chest: The visualized lung bases are clear. No intra-abdominal free air or free fluid. Hepatobiliary: No focal liver abnormality is seen. No gallstones, gallbladder wall thickening, or biliary dilatation. Pancreas: Unremarkable. No pancreatic ductal dilatation or surrounding inflammatory changes. Spleen: Normal in size without focal abnormality. Adrenals/Urinary  Tract: The adrenal glands unremarkable. There are multiple nonobstructing left renal calculi measure up to 2-3 mm. No hydronephrosis. There is no hydronephrosis or nephrolithiasis in the right kidney. The visualized ureters and urinary bladder appear unremarkable. Stomach/Bowel: There is loose stool within the colon. Multiple normal caliber fluid-filled loops of small bowel may be physiologic or represent mild enteritis. Clinical correlation is recommended. There is no bowel obstruction. The appendix is normal. Vascular/Lymphatic: The abdominal aorta and IVC  unremarkable. No portal venous gas. There is no adenopathy. Reproductive: The uterus is anteverted and grossly unremarkable. No adnexal masses. Other: There is diastasis of anterior abdominal wall musculature with a small fat containing umbilical hernia. Musculoskeletal: No acute or significant osseous findings. IMPRESSION: 1. Multiple nonobstructing left renal calculi. No hydronephrosis or obstructing stone. 2. Diarrheal state with possible mild enteritis. Clinical correlation is recommended. No bowel obstruction. Normal appendix. Electronically Signed   By: Anner Crete M.D.   On: 02/17/2021 15:12    Procedures Procedures   Medications Ordered in ED Medications  cefTRIAXone (ROCEPHIN) 1 g in sodium chloride 0.9 % 100 mL IVPB (1 g Intravenous New Bag/Given 02/17/21 1636)  sodium chloride 0.9 % bolus 1,000 mL (0 mLs Intravenous Stopped 02/17/21 1448)  ondansetron (ZOFRAN) injection 4 mg (4 mg Intravenous Given 02/17/21 1252)  iohexol (OMNIPAQUE) 350 MG/ML injection 100 mL (55 mLs Intravenous Contrast Given 02/17/21 1416)  promethazine (PHENERGAN) tablet 25 mg (25 mg Oral Given 02/17/21 1617)  sodium chloride 0.9 % bolus 500 mL (500 mLs Intravenous New Bag/Given 02/17/21 1648)    ED Course  I have reviewed the triage vital signs and the nursing notes.  Pertinent labs & imaging results that were available during my care of the patient were reviewed by me and considered in my medical decision making (see chart for details).  Clinical Course as of 02/17/21 1654  Thu Feb 17, 2021  1256 WBC(!): 12.8 [HK]  1314 I-stat hCG, quantitative: <5.0 [HK]  1314 Pulse Rate(!): 108 Somewhat improved with IV fluids. [HK]  1351 DG Chest Portable 1 View No acute findings. [HK]  1610 Pulse Rate(!): 105 [HK]    Clinical Course User Index [HK] Delia Heady, PA-C   MDM Rules/Calculators/A&P                          35 year old female with a past medical history of anxiety, GERD presenting to the ED with a chief  complaint of right-sided flank pain radiating to the right lower abdomen as well as chest pain and shortness of breath.  Regarding the right-sided flank pain it worsened today after being present for the past few days.  She reports associated nausea and vomiting.  States that it feels similar to her prior kidney stones.  She is denying any history of fever and is afebrile here.  Denies any dysuria, vaginal complaints or possibility of pregnancy.  Will obtain urinalysis, urine culture and CT renal stone study as well as lab work and reassess.  She also complains of chest pain and shortness of breath.  States that she has been checking her heart rate over the past few weeks and has been persistently high in the 110s to 130s.  She is unsure why.  She does report central pleuritic chest pain with associated shortness of breath as well.  She is currently on OCPs.  Patient tachycardic to 138 here on arrival.  Initially I thought this tachycardia could be secondary to her discomfort.  However during my exam she does not appear in acute distress and is sitting on the bed comfortably.  Her lungs are clear to auscultation bilaterally.  She has no lower extremity edema, erythema or calf tenderness and no history of DVT or PE.  However I am concerned due to her pleuritic chest pain, tachycardia and risk factors being on OCP that she may have a PE.  Will obtain EKG and CT angio of the chest and chest xray prior to CT.  Chest x-ray without any acute findings.  Work-up here significant for slight leukocytosis of 12.8, negative pregnancy test, urinalysis with small leukocytes, rare bacteria and sent for culture.  BMP with normal BUN and creatinine.  CT angio of the chest is negative for PE or other abnormalities.  CT renal stone study shows several left-sided nonobstructing stones but no evidence of hydronephrosis or obstructing stone.  There is concern for enteritis.  I believe a combination of possible pyelonephritis,  enteritis could be the cause of her tachycardia and symptoms today.  Her tachycardia has improved to low 100s here and she reports improvement in her symptoms.  She is requesting discharge home as she states that this will help her feel better.  Offered additional fluids to help with tachycardia.  I have given Rocephin here and will discharge home with remainder of antibiotic course as well as continued Phenergan.  Patient educated on slowly advancing her diet as tolerated and following up with her primary care provider and urologist.  At this time I do not believe the cause of her pain is due to kidney stone or infected kidney stone as she has no obstructing stone seen.  She remains otherwise hemodynamically stable.  Patient is agreeable to the plan.  Return precautions given.   Patient is hemodynamically stable, in NAD, and able to ambulate in the ED. Evaluation does not show pathology that would require ongoing emergent intervention or inpatient treatment. I explained the diagnosis to the patient. Pain has been managed and has no complaints prior to discharge. Patient is comfortable with above plan and is stable for discharge at this time. All questions were answered prior to disposition. Strict return precautions for returning to the ED were discussed. Encouraged follow up with PCP.   An After Visit Summary was printed and given to the patient.   Portions of this note were generated with [employer]. Dictation errors may occur despite best attempts at proofreading.  Final Clinical Impression(s) / ED Diagnoses Final diagnoses:  Pyelonephritis  Enteritis    Rx / DC Orders ED Discharge Orders         Ordered    cefpodoxime (VANTIN) 200 MG tablet  2 times daily        02/17/21 1645    promethazine (PHENERGAN) 25 MG tablet  Every 6 hours PRN        02/17/21 1645           Delia Heady, PA-C 02/17/21 1654    Lajean Saver, MD 02/19/21 2224

## 2021-02-17 NOTE — ED Triage Notes (Signed)
patient c/o right flank pain and right lower abdominal pain that radiates into the pelvic area. Patient reports a history of kidney stones. Patient also has had vomiting today.

## 2021-02-17 NOTE — ED Notes (Signed)
Provider gave pt work note

## 2021-02-18 LAB — URINE CULTURE: Culture: <10000 — AB

## 2021-02-22 DIAGNOSIS — J342 Deviated nasal septum: Secondary | ICD-10-CM | POA: Insufficient documentation

## 2021-02-22 DIAGNOSIS — J310 Chronic rhinitis: Secondary | ICD-10-CM | POA: Diagnosis not present

## 2021-03-28 DIAGNOSIS — N200 Calculus of kidney: Secondary | ICD-10-CM | POA: Diagnosis not present

## 2021-03-28 DIAGNOSIS — M545 Low back pain, unspecified: Secondary | ICD-10-CM | POA: Diagnosis not present

## 2021-03-28 DIAGNOSIS — R34 Anuria and oliguria: Secondary | ICD-10-CM | POA: Diagnosis not present

## 2021-06-02 DIAGNOSIS — J019 Acute sinusitis, unspecified: Secondary | ICD-10-CM | POA: Diagnosis not present

## 2021-10-10 DIAGNOSIS — Z01419 Encounter for gynecological examination (general) (routine) without abnormal findings: Secondary | ICD-10-CM | POA: Diagnosis not present

## 2021-10-10 DIAGNOSIS — Z6826 Body mass index (BMI) 26.0-26.9, adult: Secondary | ICD-10-CM | POA: Diagnosis not present

## 2021-10-11 DIAGNOSIS — R0981 Nasal congestion: Secondary | ICD-10-CM | POA: Diagnosis not present

## 2021-10-11 DIAGNOSIS — G8929 Other chronic pain: Secondary | ICD-10-CM | POA: Diagnosis not present

## 2021-10-11 DIAGNOSIS — M545 Low back pain, unspecified: Secondary | ICD-10-CM | POA: Diagnosis not present

## 2021-10-11 DIAGNOSIS — R000 Tachycardia, unspecified: Secondary | ICD-10-CM | POA: Diagnosis not present

## 2021-10-16 NOTE — Progress Notes (Signed)
Cardiology Office Note:    Date:  10/17/2021   ID:  [name], DOB [birth year], MRN [id number]  PCP:  Elton Sin, [facility] Providers Cardiologist:  None {   Referring MD: Carolee Rota, NP   Chief Complaint/Reason for Referral:  Tachycardia and chest pain    ASSESSMENT & PLAN:    Sinus tachycardia - Plan: EKG 12-Lead Will obtain a 3-day CO monitor to evaluate especially giving her dizziness.  We will keep follow-up open-ended.  Chest pain, unspecified type Will obtain a coronary CTA.    Anxiety This is being followed by other providers.  Continue current therapy.  Dyspnea, unspecified type Will obtain an echocardiogram.  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  My suspicion for COVID myocarditis is relatively low.     Dispo:  No follow-ups on file.      Medication Adjustments/Labs and Tests Ordered: Current medicines are reviewed at length with the patient today.  Concerns regarding medicines are outlined above.    Tests Ordered: Orders Placed This Encounter  Procedures   CT CORONARY MORPH W/CTA COR W/SCORE W/CA W/CM &/OR WO/CM   LONG TERM MONITOR (3-14 DAYS)   EKG 12-Lead   ECHOCARDIOGRAM COMPLETE     Medication Changes: No orders of the defined types were placed in this encounter.    History of Present Illness:     The patient is a 35 y.o. female with the indicated medical history here for here for recommendations regarding tachycardia and chest pain.  She was seen by her primary care provider recently and was noted to have sinus congestion and ongoing back pain..  She is noted to be tachycardic.  A TSH was drawn which was within normal limits.  Other laboratories including a creatinine and potassium were also within normal limits.  No CBC was drawn.  She denies any chest pain, palpitations, or shortness of breath.  She does have a history of panic attacks that occur several times a week.  She has had chest  pain on and off as well.  She has noticed this since contracting COVID-19.  She is not able to do as much as she was able to do prior to contracting COVID last year.  She fortunately has not required emergency room evaluation.  She sometimes notices that her heart rate will be fast and be slow.  This is based on her watch but she is not sure how accurate it is.    Previous Medical History: Past Medical History:  Diagnosis Date   Anxiety    panic attacks   Asthma    Dysrhythmia    pt states she's scheduled to waer halter monitor in July d/t "skipped beats"   GERD (gastroesophageal reflux disease)    hx of " stomach ulcers"   H. pylori infection    in middle school   Headache(784.0)    migraines   History of peptic ulcer    History of syncope    2011--  W/ PALPITATIONS  --  ECHO NORMAL   OCD (obsessive compulsive disorder)    Renal disorder    Ureteral calculi    BILATERAL     Current Medications: Current Meds  Medication Sig   acetaminophen (TYLENOL) 160 MG/5ML solution Take 31.3 mLs (1,000 mg total) by mouth every 6 (six) hours as needed for moderate pain.   amoxicillin (AMOXIL) 500 MG capsule Take 1,000 mg by mouth 2 (two) times daily.  azelastine (ASTELIN) 0.1 % nasal spray Place 1 spray into both nostrils 2 (two) times daily. Use in each nostril as directed   cetirizine (ZYRTEC) 10 MG tablet Take 10 mg by mouth daily. morning   loratadine (CLARITIN) 10 MG tablet Take 10 mg by mouth daily. night   meloxicam (MOBIC) 15 MG tablet Take 15 mg by mouth daily.   Norethindrone Acetate-Ethinyl Estrad-FE (LARIN 24 FE) 1-20 MG-MCG(24) tablet Take 1 tablet by mouth daily.   pantoprazole (PROTONIX) 40 MG tablet Take 40 mg by mouth 2 (two) times daily.   promethazine (PHENERGAN) 25 MG tablet Take 1 tablet (25 mg total) by mouth every 6 (six) hours as needed for nausea or vomiting.     Allergies:    Dilaudid [hydromorphone hcl], Iodine, and Xanax [alprazolam]   Social History:    Social History   Tobacco Use   Smoking status: Former    Packs/day: 0.50    Years: 6.00    Pack years: 3.00    Types: Cigarettes   Smokeless tobacco: Never  Vaping Use   Vaping Use: Never used  Substance Use Topics   Alcohol use: No   Drug use: No     Family Hx: Family History  Problem Relation Age of Onset   Diabetes Mother    Arthritis Mother    Hypertension Mother    Heart disease Mother    Hyperlipidemia Mother    Thyroid disease Mother    Melanoma Mother    Cancer Father    Arthritis Father    Cancer Maternal Aunt    Leukemia Maternal Aunt    Hyperlipidemia Maternal Aunt    Cancer Maternal Uncle    Hyperlipidemia Maternal Uncle    Hypertension Maternal Uncle    Diabetes Maternal Uncle    Thyroid disease Maternal Uncle    Cancer Paternal Aunt    Heart disease Paternal Aunt    Arthritis Maternal Grandmother    Hyperlipidemia Maternal Grandmother    Deep vein thrombosis Maternal Grandmother    Heart disease Maternal Grandfather    Hyperlipidemia Maternal Grandfather    Breast cancer Paternal Grandmother    Cancer Paternal Grandfather    Colon cancer Neg Hx    Stomach cancer Neg Hx      Review of Systems:   Please see the history of present illness.    All other systems reviewed and are negative.  EKGs/Labs/Other Test Reviewed:    EKG:  EKG today demonstrates sinus rhythm; prior EKGs demonstrate sinus tachycardia.  This is from April 2022.  Prior CV studies: None     Imaging studies that I have independently reviewed today:   4/22 CT IMPRESSION: 1. Normal exam.  No evidence of a pulmonary embolism; no coronary calcification  Recent Labs: 02/17/2021: BUN 15; Creatinine, Ser 0.59; Hemoglobin 14.7; Platelets 276; Potassium 3.7; Sodium 141   Recent Lipid Panel No results found for: CHOL, TRIG, HDL, LDLCALC, LDLDIRECT  OSH labs Name Result Date Reference Range  Thyroid Diagnostic Cascade   2021-10-11    TSH 0.99   0.73-7.10  Basic Metabolic    6269-48-54    Glucose 83   70-99  BUN 9   6-26  Creatinine 0.70   0.60-1.30  eGFR2021 117   >60  Sodium 139   136-145  Potassium 4.1   3.5-5.5  Chloride 103   98-107  CO2 27   22-32  Anion Gap 13.1   6.0-20.0  Calcium 9.7   8.6-10.3  Albumin 4.6  3.4-4.8    Risk Assessment/Calculations:          Physical Exam:    VS:  BP 130/72   Pulse 97   Ht 5\' 2"  (1.575 m)   Wt 148 lb (67.1 kg)   SpO2 99%   BMI 27.07 kg/m    Wt Readings from Last 3 Encounters:  10/17/21 148 lb (67.1 kg)  02/17/21 145 lb (65.8 kg)  02/11/17 171 lb (77.6 kg)    GENERAL:  No apparent distress, AOx3 HEENT:  No carotid bruits, +2 carotid impulses, no scleral icterus CAR: RRR no murmurs, gallops, rubs, or thrills RES:  Clear to auscultation bilaterally ABD:  Soft, nontender, nondistended, positive bowel sounds x 4 VASC:  +2 radial pulses, +2 carotid pulses, palpable pedal pulses NEURO:  CN 2-12 grossly intact; motor and sensory grossly intact PSYCH:  No active depression or anxiety EXT:  No edema, ecchymosis, or cyanosis    Signed, Early Osmond, MD  10/17/2021 3:15 PM    [address], [place], [place]  [address] Phone: [phone]; Fax: [phone]

## 2021-10-17 ENCOUNTER — Encounter: Payer: Self-pay | Admitting: Internal Medicine

## 2021-10-17 ENCOUNTER — Other Ambulatory Visit: Payer: Self-pay

## 2021-10-17 ENCOUNTER — Ambulatory Visit: Payer: BC Managed Care – PPO | Admitting: Internal Medicine

## 2021-10-17 ENCOUNTER — Ambulatory Visit (INDEPENDENT_AMBULATORY_CARE_PROVIDER_SITE_OTHER): Payer: BC Managed Care – PPO

## 2021-10-17 VITALS — BP 130/72 | HR 97 | Ht 62.0 in | Wt 148.0 lb

## 2021-10-17 DIAGNOSIS — F419 Anxiety disorder, unspecified: Secondary | ICD-10-CM

## 2021-10-17 DIAGNOSIS — R0600 Dyspnea, unspecified: Secondary | ICD-10-CM

## 2021-10-17 DIAGNOSIS — R000 Tachycardia, unspecified: Secondary | ICD-10-CM | POA: Diagnosis not present

## 2021-10-17 DIAGNOSIS — R079 Chest pain, unspecified: Secondary | ICD-10-CM

## 2021-10-17 DIAGNOSIS — R0602 Shortness of breath: Secondary | ICD-10-CM

## 2021-10-17 DIAGNOSIS — R0789 Other chest pain: Secondary | ICD-10-CM

## 2021-10-17 MED ORDER — DIPHENHYDRAMINE HCL 50 MG PO TABS: ORAL_TABLET | ORAL | 0 refills | Status: AC

## 2021-10-17 MED ORDER — METOPROLOL TARTRATE 100 MG PO TABS: ORAL_TABLET | ORAL | 0 refills | Status: DC

## 2021-10-17 MED ORDER — PREDNISONE 50 MG PO TABS: ORAL_TABLET | ORAL | 0 refills | Status: DC

## 2021-10-17 NOTE — Addendum Note (Signed)
Addended by: Mendel Ryder on: 10/17/2021 04:21 PM   Modules accepted: Orders

## 2021-10-17 NOTE — Patient Instructions (Addendum)
Medication Instructions:  Your physician recommends that you continue on your current medications as directed. Please refer to the Current Medication list given to you today.  *If you need a refill on your cardiac medications before your next appointment, please call your pharmacy*   Lab Work: None   If you have labs (blood work) drawn today and your tests are completely normal, you will receive your results only by: [place] (if you have MyChart) OR A paper copy in the mail If you have any lab test that is abnormal or we need to change your treatment, we will call you to review the results.   Testing/Procedures: Your physician has requested that you have an echocardiogram. Echocardiography is a painless test that uses sound waves to create images of your heart. It provides your doctor with information about the size and shape of your heart and how well your heart's chambers and valves are working. This procedure takes approximately one hour. There are no restrictions for this procedure.  A zio monitor was ordered today. It will remain on for 3 days. You will then return monitor and event diary in provided box. It takes 1-2 weeks for report to be downloaded and returned to [place]. We will call you with the results. If monitor falls off or has orange flashing light, please call Zio for further instructions.   Your physician has ordered for you to have a Coronary CTA   Follow-Up: Follow up with our office as needed    Other Instructions    Your cardiac CT will be scheduled at one of the below locations:   [facility] [address] [place], [place] [address] [phone]  If scheduled at [facility], please arrive at the [facility] main entrance (entrance A) of [facility] 30 minutes prior to test start time. You can use the FREE valet parking offered at the main entrance (encouraged to control the heart rate for the test) Proceed to the [facility] Radiology Department (first floor) to check-in and test prep.    Please follow these instructions carefully (unless otherwise directed):   On the Night Before the Test: Be sure to Drink plenty of water. Do not consume any caffeinated/decaffeinated beverages or chocolate 12 hours prior to your test. Do not take any antihistamines 12 hours prior to your test. If the patient has contrast allergy: Patient will need a prescription for Prednisone and very clear instructions (as follows): Prednisone 50 mg - take 13 hours prior to test Take another Prednisone 50 mg 7 hours prior to test Take another Prednisone 50 mg 1 hour prior to test Take Benadryl 50 mg 1 hour prior to test Patient must complete all four doses of above prophylactic medications. Patient will need a ride after test due to Benadryl.  On the Day of the Test: Drink plenty of water until 1 hour prior to the test. Do not eat any food 4 hours prior to the test. You may take your regular medications prior to the test.  Take metoprolol (Lopressor) two hours prior to test. FEMALES- please wear underwire-free bra if available, avoid dresses & tight clothing       After the Test: Drink plenty of water. After receiving IV contrast, you may experience a mild flushed feeling. This is normal. On occasion, you may experience a mild rash up to 24 hours after the test. This is not dangerous. If this occurs, you can take Benadryl 25 mg and increase  your fluid intake. If you experience trouble breathing, this can be serious. If it is severe call 911 IMMEDIATELY. If it is mild, please call our office. If you take any of these medications: Glipizide/Metformin, Avandament, Glucavance, please do not take 48 hours after completing test unless otherwise instructed.  Please allow 2-4 weeks for scheduling of routine cardiac CTs. Some insurance companies require a pre-authorization which may delay scheduling of this test.   For non-scheduling  related questions, please contact the cardiac imaging nurse navigator should you have any questions/concerns: Marchia Bond, Cardiac Imaging Nurse Navigator Gordy Clement, Cardiac Imaging Nurse Navigator [facility] Direct Office Dial: [phone]   For scheduling needs, including cancellations and rescheduling, please call [place], [phone].

## 2021-10-17 NOTE — Progress Notes (Unsigned)
Applied a 3 day Zio XT monitor to patient in the office 

## 2021-10-26 DIAGNOSIS — R0602 Shortness of breath: Secondary | ICD-10-CM | POA: Diagnosis not present

## 2021-10-28 ENCOUNTER — Telehealth (HOSPITAL_COMMUNITY): Payer: Self-pay | Admitting: *Deleted

## 2021-10-28 NOTE — Telephone Encounter (Signed)
Reaching out to patient to offer assistance regarding upcoming cardiac imaging study; pt verbalizes understanding of appt date/time, parking situation and where to check in, pre-test NPO status and medications ordered, and verified current allergies; name and call back number provided for further questions should they arise  Kathleen Clement RN Navigator Cardiac Imaging [name] [phone] office [phone] cell  Patient states she is only allergic to [facility] and had a CT scan with contrast 6 months ago without any issues. After confirming with CT, pt told not to take 13hour prep but will take 100mg  metoprolol tartrate two hours prior to cardiac CT scan.  She is aware to arrive at 10:30am for her 11am scan.

## 2021-10-31 ENCOUNTER — Encounter (HOSPITAL_COMMUNITY): Payer: Self-pay

## 2021-10-31 ENCOUNTER — Ambulatory Visit (HOSPITAL_COMMUNITY)
Admission: RE | Admit: 2021-10-31 | Discharge: 2021-10-31 | Disposition: A | Discharge status: Home / Self Care | Payer: BC Managed Care – PPO | Source: Ambulatory Visit | Attending: Internal Medicine | Admitting: Internal Medicine

## 2021-10-31 ENCOUNTER — Other Ambulatory Visit: Payer: Self-pay

## 2021-10-31 DIAGNOSIS — R0789 Other chest pain: Secondary | ICD-10-CM

## 2021-10-31 MED ORDER — IOHEXOL 350 MG/ML SOLN
95.0000 mL | Freq: Once | INTRAVENOUS | Status: AC | PRN
  Administered 2021-10-31: 11:00:00 95 mL via INTRAVENOUS

## 2021-10-31 MED ORDER — NITROGLYCERIN 0.4 MG SL SUBL
SUBLINGUAL_TABLET | SUBLINGUAL | Status: AC
  Filled 2021-10-31: qty 2

## 2021-10-31 MED ORDER — NITROGLYCERIN 0.4 MG SL SUBL: 0.8000 mg | SUBLINGUAL_TABLET | Freq: Once | SUBLINGUAL | Status: DC

## 2021-11-01 ENCOUNTER — Telehealth: Payer: Self-pay | Admitting: *Deleted

## 2021-11-01 MED ORDER — ATORVASTATIN CALCIUM 40 MG PO TABS: 40.0000 mg | ORAL_TABLET | Freq: Every day | ORAL | 3 refills | Status: DC

## 2021-11-01 MED ORDER — ASPIRIN EC 81 MG PO TBEC: 81.0000 mg | DELAYED_RELEASE_TABLET | Freq: Every day | ORAL | 3 refills | Status: DC

## 2021-11-01 NOTE — Telephone Encounter (Signed)
-----   Message from Early Osmond, MD sent at 10/31/2021  7:58 PM EST ----- Let her know she has a very mild about of blockage in one artery and this would not cause chest pain, she should follow up with PCP.  She needs to be on ASA 81 and atorvastatin 40.  Please let PCP office know about results, thx

## 2021-11-02 NOTE — Telephone Encounter (Signed)
Patient called stating she can't see CT scan report on MyChart.

## 2021-11-02 NOTE — Telephone Encounter (Signed)
Pt is not signed up for [employer].   I directed her to the website to sign up.

## 2021-11-08 ENCOUNTER — Telehealth: Payer: Self-pay | Admitting: Internal Medicine

## 2021-11-08 ENCOUNTER — Ambulatory Visit (HOSPITAL_COMMUNITY): Payer: BC Managed Care – PPO | Attending: Cardiovascular Disease

## 2021-11-08 ENCOUNTER — Other Ambulatory Visit: Payer: Self-pay

## 2021-11-08 ENCOUNTER — Encounter: Payer: Self-pay | Admitting: Internal Medicine

## 2021-11-08 DIAGNOSIS — R0602 Shortness of breath: Secondary | ICD-10-CM

## 2021-11-08 LAB — ECHOCARDIOGRAM COMPLETE
Area-P 1/2: 4.26 cm2
S' Lateral: 2.6 cm

## 2021-11-08 NOTE — Telephone Encounter (Signed)
Called patient back about her message. Reviewed patient's mychart message as well. Patient is concerned about her atorvastatin causing her flu like symptoms ( muscle aches, nausea,  and sneezing). Patient stated this all started after taking Atorvastatin and her PCP told her she could take it every other day or 1/2 tablet. Patient was concerned about doing this and felt like she was prescribed to take medication daily and she should not do this. Informed patient that we have patient do this all the time to see if they can tolerate a smaller dose or see if medication needs to be changed. Patient seem to have a better understanding and will try taking 1/2 tablet every other day, and see how she feels. Will send message to Dr. Ali Lowe and his nurse for further advisement.

## 2021-11-08 NOTE — Telephone Encounter (Signed)
See phone encounter on 11/08/21

## 2021-11-08 NOTE — Telephone Encounter (Signed)
Patient came to ECHO on 12/27 and told me she was having some flu like symptoms since taking the Atorvastatin 40mg . She said since she has begun taking it she has experienced nausea and excessive sneezing with other issues. Please call her to discuss these issues.

## 2021-11-09 ENCOUNTER — Encounter: Payer: Self-pay | Admitting: Internal Medicine

## 2021-11-09 NOTE — Telephone Encounter (Signed)
Called patient and informed her that Dr. Ali Lowe was okay with her taking atorvastatin every other day. Patient stated she did not take it today and she is doing better. Patient does not have any recent lipid panel. Will see if Dr. Ali Lowe would like to order lab work for patient.

## 2021-11-09 NOTE — Telephone Encounter (Signed)
Per Dr. Ali Lowe, Given the lack of significant CV findings, this can be managed by her PCP and we will keep follow up opened with cardiology. Called patient with advisement. Patient verbalized understanding and she will follow up with her PCP.

## 2021-11-09 NOTE — Telephone Encounter (Signed)
Spoke to patient on the phone. She is going to try to take a whole tablet every other day, and if it does not work, then she will try 1/2 tablet.

## 2021-11-14 ENCOUNTER — Encounter: Payer: Self-pay | Admitting: Internal Medicine

## 2021-12-19 DIAGNOSIS — R0789 Other chest pain: Secondary | ICD-10-CM | POA: Diagnosis not present

## 2022-02-27 DIAGNOSIS — J019 Acute sinusitis, unspecified: Secondary | ICD-10-CM | POA: Diagnosis not present

## 2022-03-29 DIAGNOSIS — K219 Gastro-esophageal reflux disease without esophagitis: Secondary | ICD-10-CM | POA: Diagnosis not present

## 2022-03-29 DIAGNOSIS — M545 Low back pain, unspecified: Secondary | ICD-10-CM | POA: Diagnosis not present

## 2022-03-29 DIAGNOSIS — R0789 Other chest pain: Secondary | ICD-10-CM | POA: Diagnosis not present

## 2022-03-30 ENCOUNTER — Other Ambulatory Visit: Payer: Self-pay | Admitting: Family Medicine

## 2022-03-30 DIAGNOSIS — R0789 Other chest pain: Secondary | ICD-10-CM

## 2022-04-11 ENCOUNTER — Ambulatory Visit
Admission: RE | Admit: 2022-04-11 | Discharge: 2022-04-11 | Disposition: A | Discharge status: Home / Self Care | Payer: BC Managed Care – PPO | Source: Ambulatory Visit | Attending: Family Medicine | Admitting: Family Medicine

## 2022-04-11 DIAGNOSIS — R0789 Other chest pain: Secondary | ICD-10-CM

## 2022-04-11 DIAGNOSIS — R110 Nausea: Secondary | ICD-10-CM | POA: Diagnosis not present

## 2022-04-11 DIAGNOSIS — R1013 Epigastric pain: Secondary | ICD-10-CM | POA: Diagnosis not present

## 2022-04-11 DIAGNOSIS — R140 Abdominal distension (gaseous): Secondary | ICD-10-CM | POA: Diagnosis not present

## 2022-05-23 DIAGNOSIS — R1013 Epigastric pain: Secondary | ICD-10-CM | POA: Diagnosis not present

## 2022-09-26 DIAGNOSIS — J019 Acute sinusitis, unspecified: Secondary | ICD-10-CM | POA: Diagnosis not present

## 2022-11-01 ENCOUNTER — Other Ambulatory Visit: Payer: Self-pay | Admitting: Internal Medicine

## 2023-01-12 DIAGNOSIS — J0110 Acute frontal sinusitis, unspecified: Secondary | ICD-10-CM | POA: Diagnosis not present

## 2023-01-25 DIAGNOSIS — Z6826 Body mass index (BMI) 26.0-26.9, adult: Secondary | ICD-10-CM | POA: Diagnosis not present

## 2023-01-25 DIAGNOSIS — Z124 Encounter for screening for malignant neoplasm of cervix: Secondary | ICD-10-CM | POA: Diagnosis not present

## 2023-01-25 DIAGNOSIS — Z01419 Encounter for gynecological examination (general) (routine) without abnormal findings: Secondary | ICD-10-CM | POA: Diagnosis not present

## 2023-01-25 DIAGNOSIS — Z1151 Encounter for screening for human papillomavirus (HPV): Secondary | ICD-10-CM | POA: Diagnosis not present

## 2023-03-05 DIAGNOSIS — J329 Chronic sinusitis, unspecified: Secondary | ICD-10-CM | POA: Diagnosis not present

## 2023-04-23 ENCOUNTER — Other Ambulatory Visit: Payer: Self-pay | Admitting: Internal Medicine

## 2023-06-14 DIAGNOSIS — I2510 Atherosclerotic heart disease of native coronary artery without angina pectoris: Secondary | ICD-10-CM | POA: Diagnosis not present

## 2023-06-14 DIAGNOSIS — M542 Cervicalgia: Secondary | ICD-10-CM | POA: Diagnosis not present

## 2023-08-10 DIAGNOSIS — M542 Cervicalgia: Secondary | ICD-10-CM | POA: Diagnosis not present

## 2023-08-10 DIAGNOSIS — D508 Other iron deficiency anemias: Secondary | ICD-10-CM | POA: Diagnosis not present

## 2023-08-10 DIAGNOSIS — K219 Gastro-esophageal reflux disease without esophagitis: Secondary | ICD-10-CM | POA: Diagnosis not present

## 2023-08-10 DIAGNOSIS — E782 Mixed hyperlipidemia: Secondary | ICD-10-CM | POA: Diagnosis not present

## 2023-08-10 DIAGNOSIS — I2510 Atherosclerotic heart disease of native coronary artery without angina pectoris: Secondary | ICD-10-CM | POA: Diagnosis not present

## 2023-08-24 DIAGNOSIS — Z1231 Encounter for screening mammogram for malignant neoplasm of breast: Secondary | ICD-10-CM | POA: Diagnosis not present

## 2023-09-07 ENCOUNTER — Ambulatory Visit (HOSPITAL_COMMUNITY)
Admission: RE | Admit: 2023-09-07 | Discharge: 2023-09-07 | Disposition: A | Discharge status: Home / Self Care | Payer: BC Managed Care – PPO | Source: Ambulatory Visit | Attending: Internal Medicine | Admitting: Internal Medicine

## 2023-09-07 ENCOUNTER — Encounter: Payer: Self-pay | Admitting: Internal Medicine

## 2023-09-07 ENCOUNTER — Ambulatory Visit: Payer: BC Managed Care – PPO | Admitting: Internal Medicine

## 2023-09-07 VITALS — BP 131/84 | HR 80 | Ht 62.0 in | Wt 146.0 lb

## 2023-09-07 DIAGNOSIS — R0609 Other forms of dyspnea: Secondary | ICD-10-CM | POA: Diagnosis not present

## 2023-09-07 DIAGNOSIS — R519 Headache, unspecified: Secondary | ICD-10-CM | POA: Diagnosis not present

## 2023-09-07 DIAGNOSIS — J309 Allergic rhinitis, unspecified: Secondary | ICD-10-CM | POA: Diagnosis not present

## 2023-09-07 DIAGNOSIS — E785 Hyperlipidemia, unspecified: Secondary | ICD-10-CM | POA: Insufficient documentation

## 2023-09-07 DIAGNOSIS — K219 Gastro-esophageal reflux disease without esophagitis: Secondary | ICD-10-CM | POA: Insufficient documentation

## 2023-09-07 DIAGNOSIS — I2510 Atherosclerotic heart disease of native coronary artery without angina pectoris: Secondary | ICD-10-CM | POA: Insufficient documentation

## 2023-09-07 DIAGNOSIS — M26609 Unspecified temporomandibular joint disorder, unspecified side: Secondary | ICD-10-CM | POA: Diagnosis not present

## 2023-09-07 DIAGNOSIS — M542 Cervicalgia: Secondary | ICD-10-CM | POA: Diagnosis not present

## 2023-09-07 NOTE — Progress Notes (Unsigned)
[name], female    DOB: [birth year]    MRN: [id number]   Brief patient profile:  37  yowf quit smoking 2016 with winter rhinitis as long she can remember  referred to pulmonary clinic in [place]  09/07/2023 by Tera Helper PA/ Dr Abigail Miyamoto  for sob assoc with persistent and perennial and worse rhinitis and 2021 and gerd x years worse with pregnancy  Same house x 2018 dogs/   History of Present Illness  09/07/2023  Pulmonary/ 1st office eval/ [name] / [place] Office zyrtec in am / clariton hs  - nasal sprays  No chief complaint on file. Dyspnea:  walking fast pace/ never ex anymore / can chase kids 20 mins  Cough: none  Sleep: bed is flat / 1 pillows SABA use: none  02: none   No obvious day to day or daytime pattern/variability or assoc excess/ purulent sputum or mucus plugs or hemoptysis or cp or chest tightness, subjective wheeze or overt sinus or hb symptoms.    Also denies any obvious fluctuation of symptoms with weather or environmental changes or other aggravating or alleviating factors except as outlined above   No unusual exposure hx or h/o childhood pna/ asthma or knowledge of premature birth.  Current Allergies, Complete Past Medical History, Past Surgical History, Family History, and Social History were reviewed in [employer] record.  ROS  The following are not active complaints unless bolded Hoarseness, sore throat, dysphagia, dental problems, itching, sneezing,  nasal congestion or discharge of excess mucus or purulent secretions, ear ache,   fever, chills, sweats, unintended wt loss or wt gain, classically pleuritic or exertional cp,  orthopnea pnd or arm/hand swelling  or leg swelling, presyncope, palpitations, abdominal pain, anorexia, nausea, vomiting, diarrhea  or change in bowel habits or change in bladder habits, change in stools or change in urine, dysuria, hematuria,  rash, arthralgias, visual complaints, headache, numbness,  weakness or ataxia or problems with walking or coordination,  change in mood or  memory.               Outpatient Medications Prior to Visit  Medication Sig Dispense Refill   acetaminophen (TYLENOL) 160 MG/5ML solution Take 31.3 mLs (1,000 mg total) by mouth every 6 (six) hours as needed for moderate pain. 120 mL 1   amoxicillin (AMOXIL) 500 MG capsule Take 1,000 mg by mouth 2 (two) times daily.     aspirin EC 81 MG tablet Take 1 tablet (81 mg total) by mouth daily. Swallow whole. 90 tablet 3   atorvastatin (LIPITOR) 40 MG tablet TAKE ONE (1) TABLET BY MOUTH EVERY DAY 30 tablet 0   azelastine (ASTELIN) 0.1 % nasal spray Place 1 spray into both nostrils 2 (two) times daily. Use in each nostril as directed     cetirizine (ZYRTEC) 10 MG tablet Take 10 mg by mouth daily. morning     diphenhydrAMINE (BENADRYL) 50 MG tablet Take 1 tablet by mouth 1 hour prior to test 1 tablet 0   loratadine (CLARITIN) 10 MG tablet Take 10 mg by mouth daily. night     meloxicam (MOBIC) 15 MG tablet Take 15 mg by mouth daily.     metoprolol tartrate (LOPRESSOR) 100 MG tablet Take 1 tablet by mouth 2 hours before CT 1 tablet 0   Norethindrone Acetate-Ethinyl Estrad-FE (LARIN 24 FE) 1-20 MG-MCG(24) tablet Take 1 tablet by mouth daily.     pantoprazole (PROTONIX) 40 MG tablet Take 40 mg by mouth 2 (  two) times daily.     predniSONE (DELTASONE) 50 MG tablet Take 1 tablet by mouth 13 hours prior to test, Take another Prednisone tablet 7 hours prior to test, Take another tablet 1 hour prior to test 3 tablet 0   promethazine (PHENERGAN) 25 MG tablet Take 1 tablet (25 mg total) by mouth every 6 (six) hours as needed for nausea or vomiting. 10 tablet 0   No facility-administered medications prior to visit.    Past Medical History:  Diagnosis Date   Anxiety    panic attacks   Asthma    Dysrhythmia    pt states she's scheduled to waer halter monitor in July d/t "skipped beats"   GERD (gastroesophageal reflux disease)     hx of " stomach ulcers"   H. pylori infection    in middle school   Headache(784.0)    migraines   History of peptic ulcer    History of syncope    2011--  W/ PALPITATIONS  --  ECHO NORMAL   OCD (obsessive compulsive disorder)    Renal disorder    Ureteral calculi    BILATERAL      Objective:     There were no vitals taken for this visit.         Assessment   No problem-specific Assessment & Plan notes found for this encounter.     Sandrea Hughs, MD 09/07/2023

## 2023-09-07 NOTE — Patient Instructions (Signed)
Continue protonix 40 mg Take 30- 60 min before your first and last meals of the day   GERD (REFLUX)  is an extremely common cause of respiratory symptoms just like yours , many times with no obvious heartburn at all.    It can be treated with medication, but also with lifestyle changes including elevation of the head of your bed (ideally with 6 -8inch blocks under the headboard of your bed),  Smoking cessation, avoidance of late meals, excessive alcohol, and avoid fatty foods, chocolate, peppermint, colas, red wine, and acidic juices such as orange juice.  NO MINT OR MENTHOL PRODUCTS SO NO COUGH DROPS  USE SUGARLESS CANDY INSTEAD (Jolley ranchers or Stover's or Life Savers) or even ice chips will also do - the key is to swallow to prevent all throat clearing. NO OIL BASED VITAMINS - use powdered substitutes.  Avoid fish oil when coughing.   Agree with ENT evaluation but you probably will need formal allergy evaluation after that depending on your blood work today   To get the most out of exercise, you need to be continuously aware that you are short of breath, but never out of breath, for at least 30 minutes daily. As you improve, it will actually be easier for you to do the same amount of exercise  in  30 minutes so always push to the level where you are short of breath.  Once you can do this, push for longer duration.  Make sure you check your oxygen saturations at highest level of activity   Please remember to go to the lab department   for your tests - we will call you with the results when they are available.     Please remember to go to the  x-ray department  @  [facility] for your tests - we will call you with the results when they are available     My office will be contacting you by phone for referral to PFTs  - if you don't hear back from my office within one week please call us back or notify us thru MyChart and we'll address it right away.   Please schedule a follow up  office visit in 6 weeks, call sooner if needed - can cancel if doing great.

## 2023-09-11 ENCOUNTER — Encounter: Payer: Self-pay | Admitting: Internal Medicine

## 2023-09-11 LAB — CBC WITH DIFFERENTIAL/PLATELET
Basophils Absolute: 0.1 10*3/uL (ref 0.0–0.2)
Basos: 1 %
EOS (ABSOLUTE): 0.3 10*3/uL (ref 0.0–0.4)
Eos: 4 %
Hematocrit: 42.6 % (ref 34.0–46.6)
Hemoglobin: 14 g/dL (ref 11.1–15.9)
Immature Grans (Abs): 0 10*3/uL (ref 0.0–0.1)
Immature Granulocytes: 1 %
Lymphocytes Absolute: 2.1 10*3/uL (ref 0.7–3.1)
Lymphs: 26 %
MCH: 30.4 pg (ref 26.6–33.0)
MCHC: 32.9 g/dL (ref 31.5–35.7)
MCV: 92 fL (ref 79–97)
Monocytes Absolute: 0.7 10*3/uL (ref 0.1–0.9)
Monocytes: 9 %
Neutrophils Absolute: 5 10*3/uL (ref 1.4–7.0)
Neutrophils: 59 %
Platelets: 297 10*3/uL (ref 150–450)
RBC: 4.61 x10E6/uL (ref 3.77–5.28)
RDW: 12.5 % (ref 11.7–15.4)
WBC: 8.3 10*3/uL (ref 3.4–10.8)

## 2023-09-11 LAB — BASIC METABOLIC PANEL
BUN/Creatinine Ratio: 14 (ref 9–23)
BUN: 9 mg/dL (ref 6–20)
CO2: 20 mmol/L (ref 20–29)
Calcium: 9 mg/dL (ref 8.7–10.2)
Chloride: 103 mmol/L (ref 96–106)
Creatinine, Ser: 0.65 mg/dL (ref 0.57–1.00)
Glucose: 76 mg/dL (ref 70–99)
Potassium: 4.3 mmol/L (ref 3.5–5.2)
Sodium: 139 mmol/L (ref 134–144)
eGFR: 118 mL/min/{1.73_m2} (ref >59)

## 2023-09-11 LAB — BRAIN NATRIURETIC PEPTIDE: BNP: 11.3 pg/mL (ref 0.0–100.0)

## 2023-09-11 LAB — IGE: IgE (Immunoglobulin E), Serum: 142 [IU]/mL (ref 6–495)

## 2023-09-11 LAB — SEDIMENTATION RATE: Sed Rate: 2 mm/h (ref 0–32)

## 2023-09-11 LAB — TSH: TSH: 0.736 u[IU]/mL (ref 0.450–4.500)

## 2023-09-11 LAB — D-DIMER, QUANTITATIVE: D-DIMER: <0.2 mg{FEU}/L (ref 0.00–0.49)

## 2023-09-11 NOTE — Assessment & Plan Note (Addendum)
Onset p covid  - Allergy screen 09/07/2023 >  Eos 0.3 /  IgE  142 - 09/07/2023   Walked on RA  x  3  lap(s) =  approx 450  ft  @ fast pace, stopped due to end of study s sob  with lowest 02 sats 98%    Symptoms are markedly disproportionate to objective findings and not clear to what extent this is actually a pulmonary  problem but pt does appear to have difficult to sort out respiratory symptoms of unknown origin for which  DDX  = almost all start with A and  include Adherence, Ace Inhibitors, Acid Reflux, Active Sinus Disease, Alpha 1 Antitripsin deficiency, Anxiety masquerading as Airways dz,  ABPA,  Allergy(esp in young), Aspiration (esp in elderly), Adverse effects of meds,  Active smoking or Vaping, A bunch of PE's/clot burden (a few small clots can't cause this syndrome unless there is already severe underlying pulm or vascular dz with poor reserve),  Anemia or thyroid disorder, plus two Bs  = Bronchiectasis and Beta blocker use..and one C= CHF    Adherence is always the initial "prime suspect" and is a multilayered concern that requires a "trust but verify" approach in every patient - starting with knowing how to use medications, especially inhalers, correctly, keeping up with refills and understanding the fundamental difference between maintenance and prns vs those medications only taken for a very short course and then stopped and not refilled.   ? Acid (or non-acid) GERD > always difficult to exclude as up to 75% of pts in some series report no assoc GI/ Heartburn symptoms> rec max (24h)  acid suppression and diet restrictions/ reviewed and instructions given in writing.   ? Allergy > pos screen noted, will refer to Allergy   ? Active sinus dz > already has plans for  ent eval   ? Anxiety/depression/ deconditoning > usually at the bottom of this list of usual suspects but   may interfere with adherence and also interpretation of response or lack thereof to symptom management which can be  quite subjective.   ? Anemia/ thyroid dz > ruled out today   ? A bunch of PEs  D dimer nl - while a normal  or high normal value (seen commonly in the elderly or chronically ill)  may miss small peripheral pe, the clot burden with sob is moderately high and the d dimer  has a very high neg pred value if used in this setting.    ? Chf > excluded by hx/ cxr and bnp very low   Reconditioning reviewed/ pfts ordered with f/u in pulmonary prn   Each maintenance medication was reviewed in detail including emphasizing most importantly the difference between maintenance and prns and under what circumstances the prns are to be triggered using an action plan format where appropriate.  Total time for H and P, chart review, counseling,  directly observing portions of ambulatory 02 saturation study/ and generating customized AVS unique to this office visit / same day charting = > 45 min new pt eval

## 2023-09-12 ENCOUNTER — Telehealth: Payer: Self-pay

## 2023-09-12 DIAGNOSIS — J309 Allergic rhinitis, unspecified: Secondary | ICD-10-CM

## 2023-09-12 NOTE — Telephone Encounter (Addendum)
Patient returned call.  Front desk gave information.  Called patient back second time to review referral information.  Patient had questions regarding labs and referral to an allergy provider.  Answered all questions by patient.  [facility] [place]. Patient verbalized understanding

## 2023-10-16 DIAGNOSIS — N390 Urinary tract infection, site not specified: Secondary | ICD-10-CM | POA: Diagnosis not present

## 2023-10-19 ENCOUNTER — Ambulatory Visit: Payer: BC Managed Care – PPO | Admitting: Internal Medicine

## 2023-10-26 ENCOUNTER — Ambulatory Visit: Payer: BC Managed Care – PPO | Admitting: Allergy & Immunology

## 2023-10-26 ENCOUNTER — Encounter: Payer: Self-pay | Admitting: Allergy & Immunology

## 2023-10-26 VITALS — BP 118/86 | HR 91 | Temp 97.9°F | Resp 16 | Ht 61.42 in | Wt 149.0 lb

## 2023-10-26 DIAGNOSIS — L500 Allergic urticaria: Secondary | ICD-10-CM

## 2023-10-26 DIAGNOSIS — J310 Chronic rhinitis: Secondary | ICD-10-CM | POA: Diagnosis not present

## 2023-10-26 DIAGNOSIS — R0609 Other forms of dyspnea: Secondary | ICD-10-CM

## 2023-10-26 DIAGNOSIS — L230 Allergic contact dermatitis due to metals: Secondary | ICD-10-CM | POA: Diagnosis not present

## 2023-10-26 NOTE — Patient Instructions (Addendum)
1. Chronic rhinitis (Primary) - Because of insurance stipulations, we cannot do skin testing on the same day as your first visit. - We are all working to fight this, but for now we need to do two separate visits.  - We will know more after we do testing at the next visit.  - The skin testing visit can be squeezed in at your convenience.  - Then we can make a more full plan to address all of your symptoms. - Be sure to stop your antihistamines for 3 days before this appointment.   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 - We can do patch testing if you want to.  3. DOE (dyspnea on exertion) - Dr. Sherene Sires is doing a thorough workup on this. - I am not going to do anything with this now.   4. Allergic urticaria - We can do testing to the most common foods. - This will rule out > 95% of all food allergens.   5. Return in about 1 week (around 11/02/2023) for SKIN TESTING (1-55 + 1-17). You can have the follow up appointment with Dr. Dellis Anes or a Nurse Practicioner (our Nurse Practitioners are excellent and always have Physician oversight!).    Please inform us of any Emergency Department visits, hospitalizations, or changes in symptoms. Call us before going to the ED for breathing or allergy symptoms since we might be able to fit you in for a sick visit. Feel free to contact us anytime with any questions, problems, or concerns.  It was a pleasure to meet you and your family today!  Websites that have reliable patient information: 1. American Academy of Asthma, Allergy, and Immunology: www.aaaai.org 2. Food Allergy Research and Education (FARE): foodallergy.org 3. Mothers of Asthmatics: http://www.asthmacommunitynetwork.org 4. American College of Allergy, Asthma, and Immunology: www.acaai.org      "Like" [place] on Facebook and Instagram for our latest updates!      A healthy democracy works best when [employer] participate! Make sure you are registered  to vote! If you have moved or changed any of your contact information, you will need to get this updated before voting! Scan the QR codes below to learn more!

## 2023-10-26 NOTE — Progress Notes (Signed)
NEW PATIENT  Date of Service/Encounter:  10/26/23  Consult requested by: Kathleen Hatch, PA-C   Assessment:   Chronic rhinitis - planning for testing at the next visit  Allergic contact dermatitis due to metals  DOE (dyspnea on exertion)  Allergic urticaria  Plan/Recommendations:   1. Chronic rhinitis (Primary) - Because of insurance stipulations, we cannot do skin testing on the same day as your first visit. - We are all working to fight this, but for now we need to do two separate visits.  - We will know more after we do testing at the next visit.  - The skin testing visit can be squeezed in at your convenience.  - Then we can make a more full plan to address all of your symptoms. - Be sure to stop your antihistamines for 3 days before this appointment.   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 - We can do patch testing if you want to.  3. DOE (dyspnea on exertion) - [name] is doing a thorough workup on this. - I am not going to do anything with this now.   4. Allergic urticaria - We can do testing to the most common foods. - This will rule out > 95% of all food allergens.   5. Return in about 1 week (around 11/02/2023) for SKIN TESTING (1-55 + 1-17). You can have the follow up appointment with Dr. Dellis Martin or a Nurse Practicioner (our Nurse Practitioners are excellent and always have Physician oversight!).    This note in its entirety was forwarded to the Provider who requested this consultation.  Subjective:   [name] is a 37 y.o. female presenting today for evaluation of  Chief Complaint  Patient presents with   Allergic Rhinitis     Sinus issues- takes zyrtec and claritin    Rash    Rash that pops up every now and then from the nickel on the belt   Urticaria    Splotchy hives after eating and from the sun    [name] has a history of the following: Patient Active Problem List   Diagnosis Date  Noted   Coronary arteriosclerosis 09/07/2023   Hyperlipidemia 09/07/2023   GERD (gastroesophageal reflux disease) 09/07/2023   DOE (dyspnea on exertion) 09/07/2023   Chronic rhinitis 02/22/2021   Deviated septum 02/22/2021   Anxiety 04/21/2019   Depressive disorder 04/21/2019   Migraine 04/21/2019   Placental abruption 02/11/2017   Ganglion of left wrist 10/19/2015   Missed abortion 07/10/2013   Asthma 09/27/2010   NEPHROLITHIASIS 09/27/2010   OVARIAN CYST 09/27/2010   SYNCOPE 09/27/2010   DIZZINESS 09/27/2010   PALPITATIONS 09/27/2010    History obtained from: chart review and patient.  Discussed the use of AI scribe software for clinical note transcription with the patient and/or guardian, who gave verbal consent to proceed.  [name] was referred by Kathleen Hatch, PA-C.     [name] is a 37 y.o. female presenting for an evaluation of chronic rhinitis and rashes .   Asthma/Respiratory Symptom History: The patient has a history of smoking, having quit eight years ago after smoking a pack and a half a day for eight years. She has never need inhalers at all for her symptoms.  She has seen [name] for her shortness of breath.  She had labs done at that time that showed a normal D-dimer as well as a normal BNP, BMP, CBC, and  IgE level.  Her eosinophil count was 300.  She did get a chest x-ray that was normal.  She has been scheduled for full pulmonary function testing on December 31.  She was never put on an inhaler at all.  Allergic Rhinitis Symptom History: [name] presents with chronic congestion that has been unresponsive to various treatments. They report a history of asthma, but no recent exacerbations. Since contracting COVID-19 in 2020, the patient has experienced persistent respiratory symptoms. They have been evaluated by an ENT specialist, who suggested antipod treatment, but the patient reports that this has been ineffective and even exacerbates their symptoms.  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 She has been experiencing frequent sinus infections, particularly in the winter, which are typically treated with amoxicillin. They report that the treatment provides temporary relief, but the infections recur monthly.   The patient also reports a recurring issue with a lymph node in their neck, which has been evaluated via ultrasound. The ENT specialist suggested acupuncture for this issue, but the patient has not pursued this treatment.   Skin Symptom History: The patient has also been experiencing hives, which seem to occur randomly and are not associated with any specific food. They also report a reaction to nickel, which causes a rash, and have been using nail polish to mitigate this reaction.  [place] animals, including horses, chickens, dogs, a rabbit, a hermit crab, a hamster, and a cockatiel. Despite their allergies, the patient expresses a strong attachment to their animals and has no intention of giving them up.   Otherwise, there is no history of other atopic diseases, including drug allergies, stinging insect allergies, or contact dermatitis. There is no significant infectious history. Vaccinations are up to date.    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 If you have any questions, ask your nurse or doctor.          STOP taking these medications    cephALEXin 500 MG capsule Commonly known as: KEFLEX Stopped by: Alfonse Spruce   fluconazole 100 MG tablet Commonly known as: DIFLUCAN Stopped by: Alfonse Spruce   fluconazole 150 MG tablet Commonly known as: DIFLUCAN Stopped by: Alfonse Spruce   nitrofurantoin (macrocrystal-monohydrate) 100 MG capsule Commonly known as: MACROBID Stopped by: Alfonse Spruce       TAKE these medications    azelastine 0.1 % nasal spray Commonly known as: ASTELIN Place 1 spray into both nostrils  2 (two) times daily. Use in each nostril as directed   cetirizine 10 MG tablet Commonly known as: ZYRTEC Take 10 mg by mouth daily. morning   COQ10 GUMMIES ADULT PO Take 250 mg by mouth daily.   cyclobenzaprine 5 MG tablet Commonly known as: FLEXERIL Take 5 mg by mouth at bedtime.   diphenhydrAMINE 50 MG tablet Commonly known as: BENADRYL Take 1 tablet by mouth 1 hour prior to test   Larin 24 FE 1-20 MG-MCG(24) tablet Generic drug: Norethindrone Acetate-Ethinyl Estrad-FE Take 1 tablet by mouth daily.   loratadine 10 MG tablet Commonly known as: CLARITIN Take 10 mg by mouth daily. night   multivitamin tablet Take 1 tablet by mouth daily.   pantoprazole 40 MG tablet Commonly known as: PROTONIX Take 40 mg by mouth 2 (two) times daily.   promethazine 25 MG tablet Commonly known as: PHENERGAN Take 1 tablet (25 mg total) by mouth every 6 (six) hours as needed for nausea or vomiting.   rosuvastatin 20 MG tablet Commonly known as: CRESTOR Take 20 mg by mouth daily.    Slow Fe 142 (45 Fe) MG Tbcr tablet Generic drug: ferrous sulfate ER Take by mouth.   sucralfate 1 g tablet Commonly known as: CARAFATE Take 1 g by mouth 2 (two) times daily.   vitamin B-12 250 MCG tablet Commonly known as: CYANOCOBALAMIN Take 250 mcg by mouth daily.        Birth History: non-contributory  Developmental History: non-contributory  Past Surgical History: Past Surgical History:  Procedure Laterality Date   CERVIX LESION DESTRUCTION     CESAREAN SECTION N/A 02/11/2017   Procedure: CESAREAN SECTION;  Surgeon: Zelphia Cairo, MD;  Location: [facility];  Service: Obstetrics;  Laterality: N/A;   CYSTOSCOPY W/ URETERAL STENT REMOVAL Bilateral 05/22/2013   Procedure: CYSTOSCOPY WITH STENT REMOVAL;  Surgeon: Sebastian Ache, MD;  Location: [facility];  Service: Urology;  Laterality: Bilateral;  cysto and bilateral stent pull   CYSTOSCOPY WITH RETROGRADE PYELOGRAM, URETEROSCOPY AND STENT PLACEMENT Bilateral 05/07/2013   Procedure: CYSTOSCOPY WITH RETROGRADE PYELOGRAM, URETEROSCOPY,  BASKET STONE EXTRACTIONS AND STENT PLACEMENT, BILATERAL;  Surgeon: Sebastian Ache, MD;  Location: [facility];  Service: Urology;  Laterality: Bilateral;   DILATION AND EVACUATION N/A 07/10/2013   Procedure: DILATATION AND EVACUATION;  Surgeon: Meriel Pica, MD;  Location: [facility];  Service: Gynecology;  Laterality: N/A;   EXTRACORPOREAL SHOCK WAVE LITHOTRIPSY     LAPAROSCOPY N/A 03/12/2014   Procedure: LAPAROSCOPY DIAGNOSTIC;  Surgeon: Meriel Pica, MD;  Location: [facility];  Service: Gynecology;  Laterality: N/A;   LEFT URETERSCOPIC STONE EXTRACTION  07-05-2006   MASS EXCISION Left 09/08/2015   Procedure: EXCISION OF LEFT WRIST  MASS;  Surgeon: Dairl Ponder, MD;  Location: [facility];  Service: Orthopedics;  Laterality: Left;   TONSILLECTOMY AND ADENOIDECTOMY     TRANSTHORACIC ECHOCARDIOGRAM  10-11-2010   NORMAL LVF/ EF 55-65%/  NORMAL VALVE'S   TYMPANOSTOMY TUBE PLACEMENT     WISDOM TOOTH EXTRACTION       Family History: Family History  Problem Relation Age of Onset   Asthma Mother    Allergic rhinitis Mother    Diabetes Mother    Arthritis Mother    Hypertension Mother    Heart disease Mother    Hyperlipidemia Mother    Thyroid disease Mother    Melanoma Mother    Cancer Father    Arthritis Father    Cancer Maternal Aunt  Leukemia Maternal Aunt    Hyperlipidemia Maternal Aunt    Cancer Maternal Uncle    Hyperlipidemia Maternal Uncle    Hypertension Maternal Uncle    Diabetes Maternal Uncle    Thyroid disease Maternal Uncle    Cancer Paternal Aunt    Heart disease Paternal Aunt    Arthritis Maternal Grandmother    Hyperlipidemia Maternal Grandmother    Deep vein thrombosis Maternal Grandmother    Heart disease Maternal Grandfather    Hyperlipidemia Maternal Grandfather    Breast cancer Paternal Grandmother    Cancer Paternal Grandfather    Colon cancer Neg Hx    Stomach cancer Neg Hx      Social History: Francis lives at home with her family. She has a slew of animals. She is not a smoker right now - but she stopped smoking 8 years ago.  She works at a [employer] right now, which is good because she gets a discount on all of her animals' food.    Review of systems otherwise negative other than that mentioned in the HPI.    Objective:   Blood pressure 118/86, pulse 91, temperature 97.9 F (36.6 C), resp. rate 16, height 5' 1.42" (1.56 m), weight 149 lb (67.6 kg), SpO2 99%, unknown if currently breastfeeding. Body mass index is 27.77 kg/m.     Physical Exam Vitals reviewed.  Constitutional:      Appearance: She is well-developed.  HENT:     Head: Normocephalic and atraumatic.     Right Ear: Tympanic membrane, ear canal and external ear normal. No drainage, swelling or tenderness. Tympanic membrane is not injected, scarred, erythematous, retracted or bulging.      Left Ear: Tympanic membrane, ear canal and external ear normal. No drainage, swelling or tenderness. Tympanic membrane is not injected, scarred, erythematous, retracted or bulging.     Nose: Mucosal edema present. No nasal deformity, septal deviation or rhinorrhea.     Right Turbinates: Enlarged, swollen and pale.     Left Turbinates: Enlarged, swollen and pale.     Right Sinus: No maxillary sinus tenderness or frontal sinus tenderness.     Left Sinus: No maxillary sinus tenderness or frontal sinus tenderness.     Comments: No polyps noted.     Mouth/Throat:     Mouth: Mucous membranes are not pale and not dry.     Pharynx: Uvula midline.  Eyes:     General: Lids are normal. Allergic shiner present.        Right eye: No discharge.        Left eye: No discharge.     Conjunctiva/sclera: Conjunctivae normal.     Right eye: Right conjunctiva is not injected. No chemosis.    Left eye: Left conjunctiva is not injected. No chemosis.    Pupils: Pupils are equal, round, and reactive to light.  Cardiovascular:     Rate and Rhythm: Normal rate and regular rhythm.     Heart sounds: Normal heart sounds.  Pulmonary:     Effort: Pulmonary effort is normal. No tachypnea, accessory muscle usage or respiratory distress.     Breath sounds: Normal breath sounds. No wheezing, rhonchi or rales.     Comments: Moving air well in all lung fields. No increased work of breathing noted.  Chest:     Chest wall: No tenderness.  Abdominal:     Tenderness: There is no abdominal tenderness. There is no guarding or rebound.  Lymphadenopathy:     Head:  Right side of head: No submandibular, tonsillar or occipital adenopathy.     Left side of head: No submandibular, tonsillar or occipital adenopathy.     Cervical: No cervical adenopathy.  Skin:    Coloration: Skin is not pale.     Findings: No abrasion, erythema, petechiae or rash. Rash is not papular, urticarial or vesicular.  Neurological:     Mental Status:  She is alert.  Psychiatric:        Behavior: Behavior is cooperative.      Diagnostic studies: deferred due to insurance stipulations that require a separate visit for testing        Malachi Bonds, MD [facility]

## 2023-11-02 ENCOUNTER — Ambulatory Visit: Payer: BC Managed Care – PPO | Admitting: Allergy & Immunology

## 2023-11-13 ENCOUNTER — Ambulatory Visit (HOSPITAL_COMMUNITY): Admission: RE | Admit: 2023-11-13 | Payer: BC Managed Care – PPO | Source: Ambulatory Visit

## 2023-11-14 NOTE — Progress Notes (Signed)
 [name], female    DOB: 24-Jun-1986    MRN: [id number]   Brief patient profile:  37  yowf  quit smoking 2016 with winter rhinitis as long she can remember  referred to pulmonary clinic in [place]  09/07/2023 by Thresa Sever PA/ Dr Frederik  for sob assoc with change in pattern to  perennial and  much worse rhinitis  ? After covid or covid vaccine around  2021 with a background of obvious  gerd x years worse with pregnancy  Same house x 2018  several dogs doesn't reccall timing of symptoms worsening before or after dog exp    History of Present Illness  09/07/2023  Pulmonary/ 1st office eval/ [name] / [place] [facility] zyrtec in am / [facility] hs  - nasal sprays  Chief Complaint  Patient presents with   Establish Care    dyspnea  Dyspnea:  walking fast pace/ never ex anymore / can still chase kids 20 mins  Cough: none  Sleep: bed is flat / 1 pillows SABA use: none  02: none   Rec Continue protonix  40 mg Take 30- 60 min before your first and last meals of the day  GERD diet reviewed, bed blocks rec  Agree with ENT evaluation but you probably will need formal allergy  evaluation after that depending on your blood work today > no abnormalities per pt  To get the most out of exercise, you need to be continuously aware that you are short of breath, but never out of breath, for at least 30 minutes daily.  Make sure you check your oxygen saturations at highest level of activity   Please remember to go to the  x-ray department> cxr nl     My office will be contacting you by phone for referral to PFTs    Allergy  screen 09/07/2023 >  Eos 0.3 /  IgE  142    11/16/2023  f/u ov/[place] office/[name] re: doe maint on no inhalers   Chief Complaint  Patient presents with   Follow-up    6 week follow up   Dyspnea:  reproducible with exertion  eg steps x one flight s assoc cp Cough: none  Sleeping: flat bed/ one pillow s    resp cc  SABA use: none 02: none      No obvious day to  day or daytime variability or assoc excess/ purulent sputum or mucus plugs or hemoptysis or cp or chest tightness, subjective wheeze or overt sinus or hb symptoms.    Also denies any obvious fluctuation of symptoms with weather or environmental changes or other aggravating or alleviating factors except as outlined above   No unusual exposure hx or h/o childhood pna/ asthma or knowledge of premature birth.  Current Allergies, Complete Past Medical History, Past Surgical History, Family History, and Social History were reviewed in [employer] record.  ROS  The following are not active complaints unless bolded Hoarseness, sore throat, dysphagia, dental problems, itching, sneezing,  nasal congestion or discharge of excess mucus or purulent secretions, ear ache,   fever, chills, sweats, unintended wt loss or wt gain, classically pleuritic or exertional cp,  orthopnea pnd or arm/hand swelling  or leg swelling, presyncope, palpitations, abdominal pain, anorexia, nausea, vomiting, diarrhea  or change in bowel habits or change in bladder habits, change in stools or change in urine, dysuria, hematuria,  rash, arthralgias, visual complaints, headache, numbness, weakness or ataxia or problems with walking or coordination,  change in mood  or  memory.        Current Meds  Medication Sig   cetirizine (ZYRTEC) 10 MG tablet Take 10 mg by mouth daily. morning   Coenzyme Q10 (COQ10 GUMMIES ADULT PO) Take 250 mg by mouth daily.   cyclobenzaprine (FLEXERIL) 5 MG tablet Take 5 mg by mouth at bedtime.   diphenhydrAMINE  (BENADRYL ) 50 MG tablet Take 1 tablet by mouth 1 hour prior to test   ferrous sulfate  ER (SLOW FE) 142 (45 Fe) MG TBCR tablet Take by mouth.   loratadine (CLARITIN) 10 MG tablet Take 10 mg by mouth daily. night   Multiple Vitamin (MULTIVITAMIN) tablet Take 1 tablet by mouth daily.   Norethindrone Acetate-Ethinyl Estrad-FE (LARIN 24 FE) 1-20 MG-MCG(24) tablet Take 1 tablet by mouth  daily.   pantoprazole  (PROTONIX ) 40 MG tablet Take 40 mg by mouth 2 (two) times daily.   promethazine  (PHENERGAN ) 25 MG tablet Take 1 tablet (25 mg total) by mouth every 6 (six) hours as needed for nausea or vomiting.   rosuvastatin (CRESTOR) 20 MG tablet Take 20 mg by mouth daily.   sucralfate (CARAFATE) 1 g tablet Take 1 g by mouth 2 (two) times daily.   vitamin B-12 (CYANOCOBALAMIN ) 250 MCG tablet Take 250 mcg by mouth daily.        Past Medical History:  Diagnosis Date   Anxiety    panic attacks   Asthma    Dysrhythmia    pt states she's scheduled to waer halter monitor in July d/t skipped beats   GERD (gastroesophageal reflux disease)    hx of  stomach ulcers   H. pylori infection    in middle school   Headache(784.0)    migraines   History of peptic ulcer    History of syncope    2011--  W/ PALPITATIONS  --  ECHO NORMAL   OCD (obsessive compulsive disorder)    Renal disorder    Ureteral calculi    BILATERAL      Objective:    Wt Readings from Last 3 Encounters:  11/16/23 148 lb 6.4 oz (67.3 kg)  10/26/23 149 lb (67.6 kg)  09/07/23 146 lb (66.2 kg)     Vital signs reviewed  11/16/2023  - Note at rest 02 sats  97% on RA   General appearance:    amb pleasant wf nad   HEENT : Oropharynx  clear / mild pseudowheeze       NECK :  without  apparent JVD/ palpable Nodes/TM    LUNGS: no acc muscle use,  Nl contour chest which is clear to A and P bilaterally without cough on insp or exp maneuvers   CV:  RRR  no s3 or murmur or increase in P2, and no edema   ABD:  soft and nontender   MS:  Gait nl   ext warm without deformities Or obvious joint restrictions  calf tenderness, cyanosis or clubbing    SKIN: warm and dry without lesions    NEURO:  alert, approp, nl sensorium with  no motor or cerebellar deficits apparent.     CXR PA and Lateral:   09/07/2023 :    I personally reviewed images and impression is as follows:     Mild CM/ no acute findings           Assessment

## 2023-11-15 ENCOUNTER — Ambulatory Visit (HOSPITAL_COMMUNITY)
Admission: RE | Admit: 2023-11-15 | Discharge: 2023-11-15 | Disposition: A | Discharge status: Home / Self Care | Payer: BC Managed Care – PPO | Source: Ambulatory Visit | Attending: Internal Medicine | Admitting: Internal Medicine

## 2023-11-15 DIAGNOSIS — R0609 Other forms of dyspnea: Secondary | ICD-10-CM | POA: Insufficient documentation

## 2023-11-15 LAB — PULMONARY FUNCTION TEST
DL/VA % pred: 103 %
DL/VA: 4.72 ml/min/mmHg/L
DLCO unc % pred: 107 %
DLCO unc: 22.35 ml/min/mmHg
FEF 25-75 Post: 3.55 L/s
FEF 25-75 Pre: 3.58 L/s
FEF2575-%Change-Post: 0 %
FEF2575-%Pred-Post: 111 %
FEF2575-%Pred-Pre: 112 %
FEV1-%Change-Post: 0 %
FEV1-%Pred-Post: 109 %
FEV1-%Pred-Pre: 110 %
FEV1-Post: 3.21 L
FEV1-Pre: 3.23 L
FEV1FVC-%Change-Post: 2 %
FEV1FVC-%Pred-Pre: 101 %
FEV6-%Change-Post: -3 %
FEV6-%Pred-Post: 107 %
FEV6-%Pred-Pre: 110 %
FEV6-Post: 3.71 L
FEV6-Pre: 3.83 L
FEV6FVC-%Pred-Post: 101 %
FEV6FVC-%Pred-Pre: 101 %
FVC-%Change-Post: -3 %
FVC-%Pred-Post: 105 %
FVC-%Pred-Pre: 109 %
FVC-Post: 3.71 L
FVC-Pre: 3.83 L
Post FEV1/FVC ratio: 87 %
Post FEV6/FVC ratio: 100 %
Pre FEV1/FVC ratio: 84 %
Pre FEV6/FVC Ratio: 100 %
RV % pred: 49 %
RV: 0.68 L
TLC % pred: 96 %
TLC: 4.57 L

## 2023-11-15 MED ORDER — ALBUTEROL SULFATE (2.5 MG/3ML) 0.083% IN NEBU
2.5000 mg | INHALATION_SOLUTION | Freq: Once | RESPIRATORY_TRACT | Status: AC
  Administered 2023-11-15: 2.5 mg via RESPIRATORY_TRACT

## 2023-11-16 ENCOUNTER — Encounter: Payer: Self-pay | Admitting: Internal Medicine

## 2023-11-16 ENCOUNTER — Ambulatory Visit: Payer: BC Managed Care – PPO | Admitting: Allergy & Immunology

## 2023-11-16 ENCOUNTER — Ambulatory Visit: Payer: BC Managed Care – PPO | Admitting: Internal Medicine

## 2023-11-16 VITALS — BP 113/71 | HR 87 | Ht 61.42 in | Wt 148.4 lb

## 2023-11-16 DIAGNOSIS — R0609 Other forms of dyspnea: Secondary | ICD-10-CM

## 2023-11-16 DIAGNOSIS — J3089 Other allergic rhinitis: Secondary | ICD-10-CM | POA: Diagnosis not present

## 2023-11-16 DIAGNOSIS — L500 Allergic urticaria: Secondary | ICD-10-CM

## 2023-11-16 DIAGNOSIS — J302 Other seasonal allergic rhinitis: Secondary | ICD-10-CM

## 2023-11-16 DIAGNOSIS — L230 Allergic contact dermatitis due to metals: Secondary | ICD-10-CM

## 2023-11-16 MED ORDER — LEVOCETIRIZINE DIHYDROCHLORIDE 5 MG PO TABS: 5.0000 mg | ORAL_TABLET | Freq: Two times a day (BID) | ORAL | 1 refills | Status: AC

## 2023-11-16 MED ORDER — RYALTRIS 665-25 MCG/ACT NA SUSP: 2.0000 | Freq: Two times a day (BID) | NASAL | 5 refills | Status: AC | PRN

## 2023-11-16 NOTE — Patient Instructions (Signed)
 My office will be contacting you by phone for referral to CPST next available  done in [facility] - if you don't hear back from my office within one week please call us  back or notify us  thru MyChart and we'll address it right away.    To get the most out of exercise, you need to be continuously aware that you are short of breath, but never out of breath, for at least 30 minutes daily. As you improve, it will actually be easier for you to do the same amount of exercise  in  30 minutes so always push to the level where you are short of breath.      Pulmonary follow up is as needed - let me know if you want to see a speech therapist  for possible vocal cord dysfunction.

## 2023-11-16 NOTE — Progress Notes (Signed)
 FOLLOW UP  Date of Service/Encounter:  11/16/23   Assessment:   Perennial and seasonal allergic rhinitis (grasses, ragweed, weeds, trees, indoor molds, outdoor molds, dust mites, and cat)   Allergic contact dermatitis due to metals   DOE (dyspnea on exertion)   Allergic urticaria    Plan/Recommendations:   1. Chronic rhinitis - Testing today showed: grasses, ragweed, weeds, trees, indoor molds, outdoor molds, dust mites, and cat - Copy of test results provided.  - Avoidance measures provided. - Stop taking: current antihistamines  - Start taking: Xyzal  (levocetirizine) 5mg  tablet once TWICE DAILY and Ryaltris  (olopatadine/mometasone) two sprays per nostril 1-2 times daily as needed - You can use an extra dose of the antihistamine, if needed, for breakthrough symptoms.  - Consider nasal saline rinses 1-2 times daily to remove allergens from the nasal cavities as well as help with mucous clearance (this is especially helpful to do before the nasal sprays are given) - Strongly consider allergy  shots as a means of long-term control. - Allergy  shots re-train and reset the immune system to ignore environmental allergens and decrease the resulting immune response to those allergens (sneezing, itchy watery eyes, runny nose, nasal congestion, etc).    - Allergy  shots improve symptoms in 75-85% of patients.  - We can discuss more at the next appointment if the medications are not working for you. - I would recommend rush immunotherapy since you live a bit farther away. - CPT codes provided today.  - Call us  when you make a decision.   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 - We can do patch testing if you want to.  3. DOE (dyspnea on exertion) - Dr. Darlean is doing a thorough workup on this. - I am not going to do anything with this now.   4. Allergic urticaria - Testing was negative to the most common foods.  - This will rule out > 95% of all  food allergens.  - Copy of testing results provided today.   5. Return in about 3 months (around 02/14/2024). You can have the follow up appointment with Dr. Iva or a Nurse Practicioner (our Nurse Practitioners are excellent and always have Physician oversight!).    Subjective:   [name] is a 38 y.o. female presenting today for follow up of No chief complaint on file.   Lorenna TORA PRUNTY has a history of the following: Patient Active Problem List   Diagnosis Date Noted   Coronary arteriosclerosis 09/07/2023   Hyperlipidemia 09/07/2023   GERD (gastroesophageal reflux disease) 09/07/2023   DOE (dyspnea on exertion) 09/07/2023   Chronic rhinitis 02/22/2021   Deviated septum 02/22/2021   Anxiety 04/21/2019   Depressive disorder 04/21/2019   Migraine 04/21/2019   Placental abruption 02/11/2017   Ganglion of left wrist 10/19/2015   Missed abortion 07/10/2013   Asthma 09/27/2010   NEPHROLITHIASIS 09/27/2010   OVARIAN CYST 09/27/2010   SYNCOPE 09/27/2010   DIZZINESS 09/27/2010   PALPITATIONS 09/27/2010    History obtained from: chart review and patient.  Discussed the use of AI scribe software for clinical note transcription with the patient and/or guardian, who gave verbal consent to proceed.  Kerria is a 38 y.o. female presenting for skin testing. She was last seen on December 13. We could not do testing because her insurance company does not cover testing on the same day as a New Patient visit. She has been off of all antihistamines 3 days in anticipation of  the testing.   She was experiencing rhinitis as well as we ll as shortness of breath and hives.  Also reported allergic contact dermatitis due to nickel.  Otherwise, there have been no changes to her past medical history, surgical history, family history, or social history.    Review of systems otherwise negative other than that mentioned in the HPI.    Objective:   unknown if currently  breastfeeding. There is no height or weight on file to calculate BMI.    Physical exam deferred since this was a skin testing appointment only.   Diagnostic studies:   Allergy  Studies:     Airborne Adult Perc - 11/16/23 1346     Time Antigen Placed 1346    Allergen Manufacturer Jestine    Location Back    Number of Test 55    1. Control-Buffer 50% Glycerol Negative    2. Control-Histamine 2+    3. Bahia Negative    4. Bermuda Negative    5. Johnson Negative    6. Kentucky  Blue 3+    7. Meadow Fescue 3+    8. Perennial Rye 3+    9. Timothy 3+    10. Ragweed Mix 3+    11. Cocklebur Negative    12. Plantain,  English Negative    13. Baccharis Negative    14. Dog Fennel 3+    15. Russian Thistle 2+    16. Lamb's Quarters Negative    17. Sheep Sorrell 3+    18. Rough Pigweed Negative    19. Marsh Elder, Rough Negative    20. Mugwort, Common 3+    21. Box, Elder Negative    22. Cedar, red Negative    23. Sweet Gum Negative    24. Pecan Pollen 3+    25. Pine Mix Negative    26. Walnut, Black Pollen 3+    27. Red Mulberry 3+    28. Ash Mix Negative    29. Birch Mix 3+    30. Beech American Negative    31. Cottonwood, Eastern Negative    32. Hickory, White Negative    33. Maple Mix Negative    34. Oak, Eastern Mix 3+    35. Sycamore Eastern 3+    36. Alternaria Alternata 4+    37. Cladosporium Herbarum 2+    38. Aspergillus Mix 3+    39. Penicillium Mix 2+    40. Bipolaris Sorokiniana (Helminthosporium) 3+    41. Drechslera Spicifera (Curvularia) 3+    42. Mucor Plumbeus Negative    43. Fusarium Moniliforme 2+    44. Aureobasidium Pullulans (pullulara) 2+    45. Rhizopus Oryzae Negative    46. Botrytis Cinera 3+    47. Epicoccum Nigrum 3+    48. Phoma Betae 3+    49. Dust Mite Mix 4+    50. Cat Hair 10,000 BAU/ml Negative    51.  Dog Epithelia Negative    52. Mixed Feathers Negative    53. Horse Epithelia Negative    54. Cockroach, German Negative    55.  Tobacco Leaf Negative             Intradermal - 11/16/23 1417     Time Antigen Placed 1430    Allergen Manufacturer Greer    Location Arm    Number of Test 7    Control Negative    Bahia Negative    Bermuda Negative    Johnson Negative    Cat 3+  Dog Negative    Cockroach Negative             Food Adult Perc - 11/16/23 1300     Time Antigen Placed 1346    Allergen Manufacturer Jestine    Location Back    Number of allergen test 17     Control-buffer 50% Glycerol Negative    Control-Histamine 2+    1. Peanut Negative    2. Soybean Negative    3. Wheat Negative    4. Sesame Negative    5. Milk, Cow Negative    6. Casein Negative    7. Egg White, Chicken Negative    8. Shellfish Mix Negative    9. Fish Mix Negative    10. Cashew Negative    11. Walnut Food Negative    12. Almond Negative    13. Hazelnut Negative    14. Pecan Food Negative    15. Pistachio Negative    16. Brazil Nut Negative    17. Coconut Negative             Allergy  testing results were read and interpreted by myself, documented by clinical staff.      Marty Shaggy, MD  [facility] 

## 2023-11-16 NOTE — Patient Instructions (Addendum)
 1. Chronic rhinitis - Testing today showed: grasses, ragweed, weeds, trees, indoor molds, outdoor molds, dust mites, and cat - Copy of test results provided.  - Avoidance measures provided. - Stop taking: current antihistamines  - Start taking: Xyzal  (levocetirizine) 5mg  tablet once TWICE DAILY and Ryaltris  (olopatadine/mometasone) two sprays per nostril 1-2 times daily as needed - You can use an extra dose of the antihistamine, if needed, for breakthrough symptoms.  - Consider nasal saline rinses 1-2 times daily to remove allergens from the nasal cavities as well as help with mucous clearance (this is especially helpful to do before the nasal sprays are given) - Strongly consider allergy  shots as a means of long-term control. - Allergy  shots re-train and reset the immune system to ignore environmental allergens and decrease the resulting immune response to those allergens (sneezing, itchy watery eyes, runny nose, nasal congestion, etc).    - Allergy  shots improve symptoms in 75-85% of patients.  - We can discuss more at the next appointment if the medications are not working for you. - I would recommend rush immunotherapy since you live a bit farther away. - CPT codes provided today.  - Call us  when you make a decision.   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 - We can do patch testing if you want to.  3. DOE (dyspnea on exertion) - Dr. Darlean is doing a thorough workup on this. - I am not going to do anything with this now.   4. Allergic urticaria - Testing was negative to the most common foods.  - This will rule out > 95% of all food allergens.  - Copy of testing results provided today.   5. Return in about 3 months (around 02/14/2024). You can have the follow up appointment with Dr. Iva or a Nurse Practicioner (our Nurse Practitioners are excellent and always have Physician oversight!).    Please inform us  of any Emergency Department visits,  hospitalizations, or changes in symptoms. Call us  before going to the ED for breathing or allergy  symptoms since we might be able to fit you in for a sick visit. Feel free to contact us  anytime with any questions, problems, or concerns.  It was a pleasure to meet you and your family today!  Websites that have reliable patient information: 1. American Academy of Asthma, Allergy , and Immunology: www.aaaai.org 2. Food Allergy  Research and Education (FARE): foodallergy.org 3. Mothers of Asthmatics: http://www.asthmacommunitynetwork.org 4. [employer] of Allergy , Asthma, and Immunology: www.acaai.org      "Like" us  on Facebook and Instagram for our latest updates!      A healthy democracy works best when [employer] participate! Make sure you are registered to vote! If you have moved or changed any of your contact information, you will need to get this updated before voting! Scan the QR codes below to learn more!      Airborne Adult Perc - 11/16/23 1346     Time Antigen Placed 1346    Allergen Manufacturer Jestine    Location Back    Number of Test 55    1. Control-Buffer 50% Glycerol Negative    2. Control-Histamine 2+    3. Bahia Negative    4. Bermuda Negative    5. Johnson Negative    6. [place]  Blue 3+    7. Meadow Fescue 3+    8. Perennial Rye 3+    9. Timothy 3+    10. Ragweed Mix 3+  11. Cocklebur Negative    12. Plantain,  English Negative    13. Baccharis Negative    14. Dog Fennel 3+    15. Russian Thistle 2+    16. Lamb's Quarters Negative    17. Sheep Sorrell 3+    18. Rough Pigweed Negative    19. Marsh Elder, Rough Negative    20. Mugwort, Common 3+    21. Box, Elder Negative    22. Cedar, red Negative    23. Sweet Gum Negative    24. Pecan Pollen 3+    25. Pine Mix Negative    26. Walnut, Black Pollen 3+    27. Red Mulberry 3+    28. Ash Mix Negative    29. Birch Mix 3+    30. Beech American Negative    31. Cottonwood, Eastern Negative    32.  Hickory, White Negative    33. Maple Mix Negative    34. Oak, Eastern Mix 3+    35. Sycamore Eastern 3+    36. Alternaria Alternata 4+    37. Cladosporium Herbarum 2+    38. Aspergillus Mix 3+    39. Penicillium Mix 2+    40. Bipolaris Sorokiniana (Helminthosporium) 3+    41. Drechslera Spicifera (Curvularia) 3+    42. Mucor Plumbeus Negative    43. Fusarium Moniliforme 2+    44. Aureobasidium Pullulans (pullulara) 2+    45. Rhizopus Oryzae Negative    46. Botrytis Cinera 3+    47. Epicoccum Nigrum 3+    48. Phoma Betae 3+    49. Dust Mite Mix 4+    50. Cat Hair 10,000 BAU/ml Negative    51.  Dog Epithelia Negative    52. Mixed Feathers Negative    53. Horse Epithelia Negative    54. Cockroach, German Negative    55. Tobacco Leaf Negative             Intradermal - 11/16/23 1417     Time Antigen Placed 1430    Allergen Manufacturer Greer    Location Arm    Number of Test 7    Control Negative    Bahia Negative    Bermuda Negative    Johnson Negative    Cat 3+    Dog Negative    Cockroach Negative             Food Adult Perc - 11/16/23 1300     Time Antigen Placed 1346    Allergen Manufacturer Jestine    Location Back    Number of allergen test 17     Control-buffer 50% Glycerol Negative    Control-Histamine 2+    1. Peanut Negative    2. Soybean Negative    3. Wheat Negative    4. Sesame Negative    5. Milk, Cow Negative    6. Casein Negative    7. Egg White, Chicken Negative    8. Shellfish Mix Negative    9. Fish Mix Negative    10. Cashew Negative    11. Walnut Food Negative    12. Almond Negative    13. Hazelnut Negative    14. Pecan Food Negative    15. Pistachio Negative    16. Brazil Nut Negative    17. Coconut Negative             Reducing Pollen Exposure  The American Academy of Allergy , Asthma and Immunology suggests the following steps to reduce your exposure to  pollen during allergy  seasons.    Do not hang sheets or  clothing out to dry; pollen may collect on these items. Do not mow lawns or spend time around freshly cut grass; mowing stirs up pollen. Keep windows closed at night.  Keep car windows closed while driving. Minimize morning activities outdoors, a time when pollen counts are usually at their highest. Stay indoors as much as possible when pollen counts or humidity is high and on windy days when pollen tends to remain in the air longer. Use air conditioning when possible.  Many air conditioners have filters that trap the pollen spores. Use a HEPA room air filter to remove pollen form the indoor air you breathe.  Control of Mold Allergen   Mold and fungi can grow on a variety of surfaces provided certain temperature and moisture conditions exist.  Outdoor molds grow on plants, decaying vegetation and soil.  The major outdoor mold, Alternaria and Cladosporium, are found in very high numbers during hot and dry conditions.  Generally, a late Summer - Fall peak is seen for common outdoor fungal spores.  Rain will temporarily lower outdoor mold spore count, but counts rise rapidly when the rainy period ends.  The most important indoor molds are Aspergillus and Penicillium.  Dark, humid and poorly ventilated basements are ideal sites for mold growth.  The next most common sites of mold growth are the bathroom and the kitchen.  Outdoor (Seasonal) Mold Control  Positive outdoor molds via skin testing: Alternaria, Cladosporium, Bipolaris (Helminthsporium), Drechslera (Curvalaria), and Epicoccum  Use air conditioning and keep windows closed Avoid exposure to decaying vegetation. Avoid leaf raking. Avoid grain handling. Consider wearing a face mask if working in moldy areas.    Indoor (Perennial) Mold Control   Positive indoor molds via skin testing: Aspergillus, Penicillium, Fusarium, Aureobasidium (Pullulara), Botrytis, and Phoma  Maintain humidity below 50%. Clean washable surfaces with 5% bleach  solution. Remove sources e.g. contaminated carpets.    Control of Dog or Cat Allergen  Avoidance is the best way to manage a dog or cat allergy . If you have a dog or cat and are allergic to dog or cats, consider removing the dog or cat from the home. If you have a dog or cat but don't want to find it a new home, or if your family wants a pet even though someone in the household is allergic, here are some strategies that may help keep symptoms at bay:  Keep the pet out of your bedroom and restrict it to only a few rooms. Be advised that keeping the dog or cat in only one room will not limit the allergens to that room. Don't pet, hug or kiss the dog or cat; if you do, wash your hands with soap and water . High-efficiency particulate air (HEPA) cleaners run continuously in a bedroom or living room can reduce allergen levels over time. Regular use of a high-efficiency vacuum cleaner or a central vacuum can reduce allergen levels. Giving your dog or cat a bath at least once a week can reduce airborne allergen.  Control of Dust Mite Allergen    Dust mites play a major role in allergic asthma and rhinitis.  They occur in environments with high humidity wherever human skin is found.  Dust mites absorb humidity from the atmosphere (ie, they do not drink) and feed on organic matter (including shed human and animal skin).  Dust mites are a microscopic type of insect that you cannot see with the naked  eye.  High levels of dust mites have been detected from mattresses, pillows, carpets, upholstered furniture, bed covers, clothes, soft toys and any woven material.  The principal allergen of the dust mite is found in its feces.  A gram of dust may contain 1,000 mites and 250,000 fecal particles.  Mite antigen is easily measured in the air during house cleaning activities.  Dust mites do not bite and do not cause harm to humans, other than by triggering allergies/asthma.    Ways to decrease your exposure to  dust mites in your home:  Encase mattresses, box springs and pillows with a mite-impermeable barrier or cover   Wash sheets, blankets and drapes weekly in hot water  (130 F) with detergent and dry them in a dryer on the hot setting.  Have the room cleaned frequently with a vacuum cleaner and a damp dust-mop.  For carpeting or rugs, vacuuming with a vacuum cleaner equipped with a high-efficiency particulate air (HEPA) filter.  The dust mite allergic individual should not be in a room which is being cleaned and should wait 1 hour after cleaning before going into the room. Do not sleep on upholstered furniture (eg, couches).   If possible removing carpeting, upholstered furniture and drapery from the home is ideal.  Horizontal blinds should be eliminated in the rooms where the person spends the most time (bedroom, study, television room).  Washable vinyl, roller-type shades are optimal. Remove all non-washable stuffed toys from the bedroom.  Wash stuffed toys weekly like sheets and blankets above.   Reduce indoor humidity to less than 50%.  Inexpensive humidity monitors can be purchased at most hardware stores.  Do not use a humidifier as can make the problem worse and are not recommended.  Allergy  Shots  Allergies are the result of a chain reaction that starts in the immune system. Your immune system controls how your body defends itself. For instance, if you have an allergy  to pollen, your immune system identifies pollen as an invader or allergen. Your immune system overreacts by producing antibodies called Immunoglobulin E (IgE). These antibodies travel to cells that release chemicals, causing an allergic reaction.  The concept behind allergy  immunotherapy, whether it is received in the form of shots or tablets, is that the immune system can be desensitized to specific allergens that trigger allergy  symptoms. Although it requires time and patience, the payback can be long-term relief. Allergy  injections  contain a dilute solution of those substances that you are allergic to based upon your skin testing and allergy  history.   How Do Allergy  Shots Work?  Allergy  shots work much like a vaccine. Your body responds to injected amounts of a particular allergen given in increasing doses, eventually developing a resistance and tolerance to it. Allergy  shots can lead to decreased, minimal or no allergy  symptoms.  There generally are two phases: build-up and maintenance. Build-up often ranges from three to six months and involves receiving injections with increasing amounts of the allergens. The shots are typically given once or twice a week, though more rapid build-up schedules are sometimes used.  The maintenance phase begins when the most effective dose is reached. This dose is different for each person, depending on how allergic you are and your response to the build-up injections. Once the maintenance dose is reached, there are longer periods between injections, typically two to four weeks.  Occasionally doctors give cortisone-type shots that can temporarily reduce allergy  symptoms. These types of shots are different and should not be confused  with allergy  immunotherapy shots.  Who Can Be Treated with Allergy  Shots?  Allergy  shots may be a good treatment approach for people with allergic rhinitis (hay fever), allergic asthma, conjunctivitis (eye allergy ) or stinging insect allergy .   Before deciding to begin allergy  shots, you should consider:   The length of allergy  season and the severity of your symptoms  Whether medications and/or changes to your environment can control your symptoms  Your desire to avoid long-term medication use  Time: allergy  immunotherapy requires a major time commitment  Cost: may vary depending on your insurance coverage  Allergy  shots for children age 23 and older are effective and often well tolerated. They might prevent the onset of new allergen sensitivities or the  progression to asthma.  Allergy  shots are not started on patients who are pregnant but can be continued on patients who become pregnant while receiving them. In some patients with other medical conditions or who take certain common medications, allergy  shots may be of risk. It is important to mention other medications you talk to your allergist.   What are the two types of build-ups offered:   RUSH or Rapid Desensitization -- one day of injections lasting from 8:30-4:30pm, injections every 1 hour.  Approximately half of the build-up process is completed in that one day.  The following week, normal build-up is resumed, and this entails ~16 visits either weekly or twice weekly, until reaching your "maintenance dose" which is continued weekly until eventually getting spaced out to every month for a duration of 3 to 5 years. The regular build-up appointments are nurse visits where the injections are administered, followed by required monitoring for 30 minutes.    Traditional build-up -- weekly visits for 6 -12 months until reaching "maintenance dose", then continue weekly until eventually spacing out to every 4 weeks as above. At these appointments, the injections are administered, followed by required monitoring for 30 minutes.     Either way is acceptable, and both are equally effective. With the rush protocol, the advantage is that less time is spent here for injections overall AND you would also reach maintenance dosing faster (which is when the clinical benefit starts to become more apparent). Not everyone is a candidate for rapid desensitization.   IF we proceed with the RUSH protocol, there are premedications which must be taken the day before and the day after the rush only (this includes antihistamines, steroids, and Singulair).  After the rush day, no prednisone  or Singulair is required, and we just recommend antihistamines taken on your injection day.  What Is An Estimate of the Costs?  If  you are interested in starting allergy  injections, please check with your insurance company about your coverage for both allergy  vial sets and allergy  injections.  Please do so prior to making the appointment to start injections.  The following are CPT codes to give to your insurance company. These are the amounts we BILL to the insurance company, but the amount YOU WILL PAY and WE RECEIVE IS SUBSTANTIALLY LESS and depends on the contracts we have with different insurance companies.   Amount Billed to Insurance Two allergy  vial set  CPT 95165   $ 2400      Two injections   CPT 95117   $ 40  RUSH (Rapid Desensitization) CPT 95180 x 8 hours  $500/hour  Regarding the allergy  injections, your co-pay may or may not apply with each injection, so please confirm this with your insurance company. When you start allergy   injections, 1 or 2 sets of vials are made based on your allergies.  Not all patients can be on one set of vials. A set of vials lasts 6 months to a year depending on how quickly you can proceed with your build-up of your allergy  injections. Vials are personalized for each patient depending on their specific allergens.  How often are allergy  injection given during the build-up period?   Injections are given at least weekly during the build-up period until your maintenance dose is achieved. Per the doctor's discretion, you may have the option of getting allergy  injections two times per week during the build-up period. However, there must be at least 48 hours between injections. The build-up period is usually completed within 6-12 months depending on your ability to schedule injections and for adjustments for reactions. When maintenance dose is reached, your injection schedule is gradually changed to every two weeks and later to every three weeks. Injections will then continue every 4 weeks. Usually, injections are continued for a total of 3-5 years.   When Will I Feel Better?  Some may  experience decreased allergy  symptoms during the build-up phase. For others, it may take as long as 12 months on the maintenance dose. If there is no improvement after a year of maintenance, your allergist will discuss other treatment options with you.  If you aren't responding to allergy  shots, it may be because there is not enough dose of the allergen in your vaccine or there are missing allergens that were not identified during your allergy  testing. Other reasons could be that there are high levels of the allergen in your environment or major exposure to non-allergic triggers like tobacco smoke.  What Is the Length of Treatment?  Once the maintenance dose is reached, allergy  shots are generally continued for three to five years. The decision to stop should be discussed with your allergist at that time. Some people may experience a permanent reduction of allergy  symptoms. Others may relapse and a longer course of allergy  shots can be considered.  What Are the Possible Reactions?  The two types of adverse reactions that can occur with allergy  shots are local and systemic. Common local reactions include very mild redness and swelling at the injection site, which can happen immediately or several hours after. Report a delayed reaction from your last injection. These include arm swelling or runny nose, watery eyes or cough that occurs within 12-24 hours after injection. A systemic reaction, which is less common, affects the entire body or a particular body system. They are usually mild and typically respond quickly to medications. Signs include increased allergy  symptoms such as sneezing, a stuffy nose or hives.   Rarely, a serious systemic reaction called anaphylaxis can develop. Symptoms include swelling in the throat, wheezing, a feeling of tightness in the chest, nausea or dizziness. Most serious systemic reactions develop within 30 minutes of allergy  shots. This is why it is strongly recommended you  wait in your doctor's office for 30 minutes after your injections. Your allergist is trained to watch for reactions, and his or her staff is trained and equipped with the proper medications to identify and treat them.   Report to the nurse immediately if you experience any of the following symptoms: swelling, itching or redness of the skin, hives, watery eyes/nose, breathing difficulty, excessive sneezing, coughing, stomach pain, diarrhea, or light headedness. These symptoms may occur within 15-20 minutes after injection and may require medication.   Who Should Administer Allergy  Shots?  The preferred location for receiving shots is your prescribing allergist's office. Injections can sometimes be given at another facility where the physician and staff are trained to recognize and treat reactions, and have received instructions by your prescribing allergist.  What if I am late for an injection?   Injection dose will be adjusted depending upon how many days or weeks you are late for your injection.   What if I am sick?   Please report any illness to the nurse before receiving injections. She may adjust your dose or postpone injections depending on your symptoms. If you have fever, flu, sinus infection or chest congestion it is best to postpone allergy  injections until you are better. Never get an allergy  injection if your asthma is causing you problems. If your symptoms persist, seek out medical care to get your health problem under control.  What If I am or Become Pregnant:  Women that become pregnant should schedule an appointment with [facility] before receiving any further allergy  injections.

## 2023-11-17 NOTE — Assessment & Plan Note (Signed)
 Onset p covid 2021  - Allergy  screen 09/07/2023 >  Eos 0.3 /  IgE  142 - 09/07/2023   Walked on RA  x  3  lap(s) =  approx 450  ft  @ fast pace, stopped due to end of study s sob  with lowest 02 sats 98%   - 11/15/23  PFTs wnl x for slt truncation of insp portion of f/v loop without true plateau p rx with albuterol  and had same doe walking to study as from study to parking lot   The f/v loop findings esp post saba and cc  doe walking to car p study do not support a dx of asthma at all, allergic or otherwise.  I note she has a h/o neck spasms but no obvious VCD as suggested by FV loop findings above so I doubt Speech therapy can help here until w/u is complete -   Rec: Regular sub max ex at least 30 min daily  Proceed with CPST if no training benefit ENT/ speech therapy eval prn    Discussed in detail all the  indications, usual  risks and alternatives  relative to the benefits with patient who agrees to proceed with w/u as outlined.            Each maintenance medication was reviewed in detail including emphasizing most importantly the difference between maintenance and prns and under what circumstances the prns are to be triggered using an action plan format where appropriate.  Total time for H and P, chart review, counseling,  and generating customized AVS unique to this office visit / same day charting = summary final pulmonary f/u ov.

## 2023-11-18 ENCOUNTER — Encounter: Payer: Self-pay | Admitting: Allergy & Immunology

## 2023-11-18 IMAGING — US US ABDOMEN LIMITED
1 series · 14 of 25 positions shown · non-contrast
Comparison: 02/17/2021 CT

CLINICAL DATA: Epigastric pain.  Bloating and nausea.

EXAM:
ULTRASOUND ABDOMEN LIMITED RIGHT UPPER QUADRANT

[Series 1: us abdomen limited · 0.15mm/px · 14 of 55 slices shown]
[im 1/55]
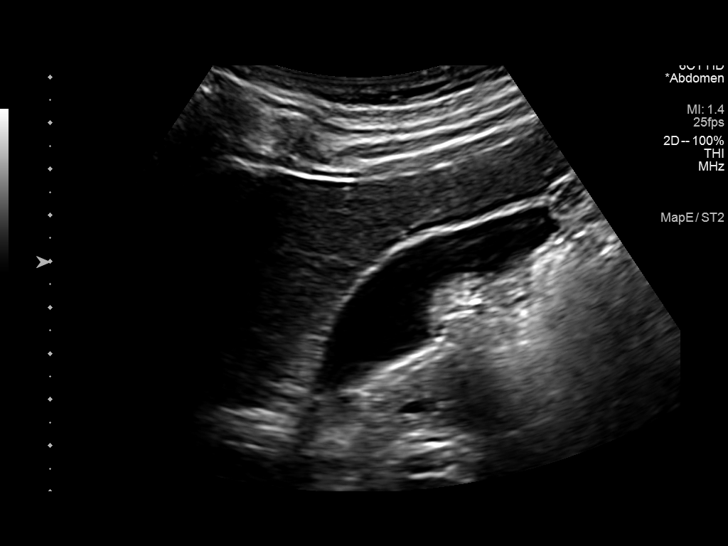
[im 5/55]
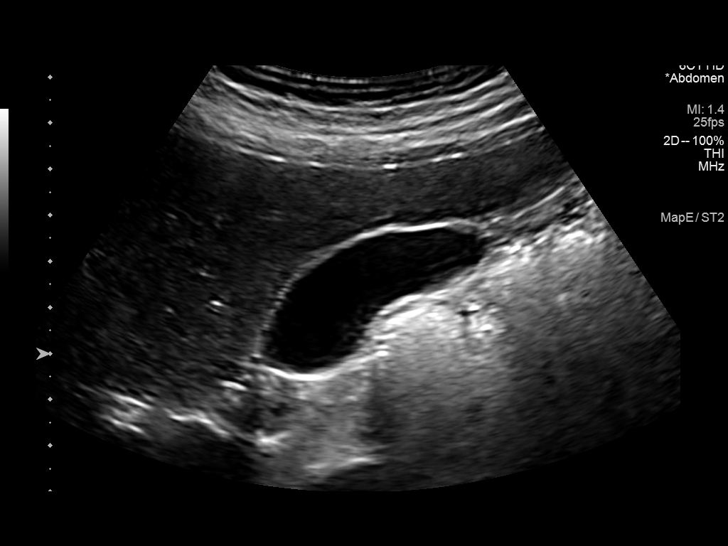
[im 10/55]
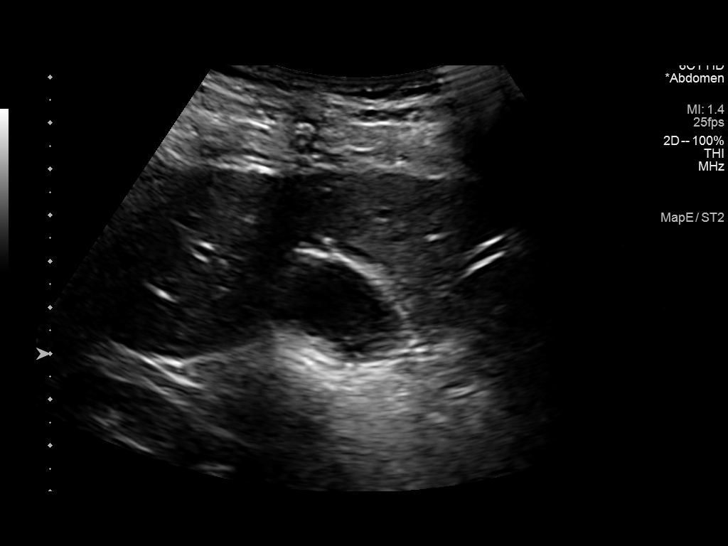
[im 14/55]
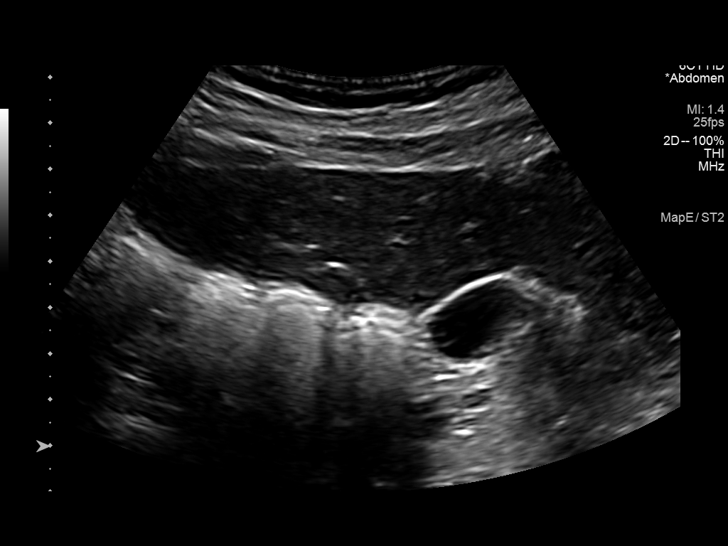
[im 19/55]
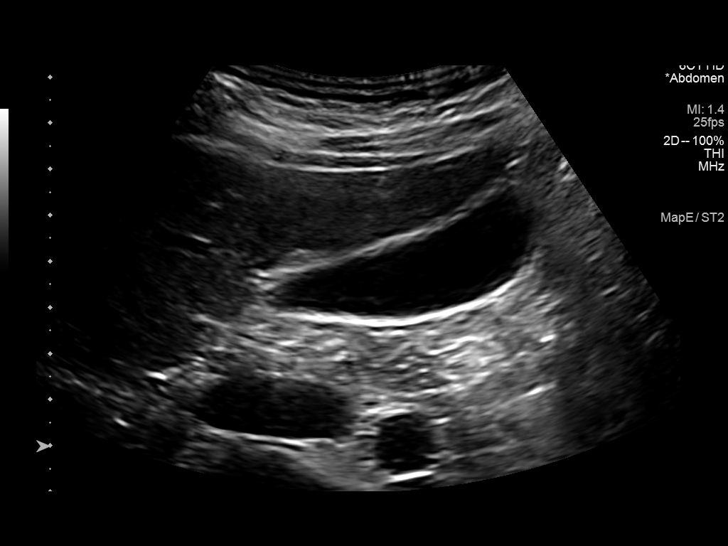
[im 21/55]
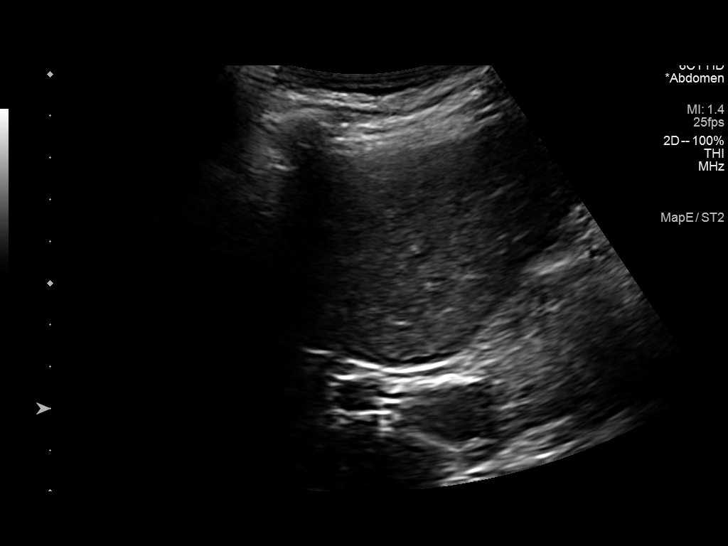
[im 25/55]
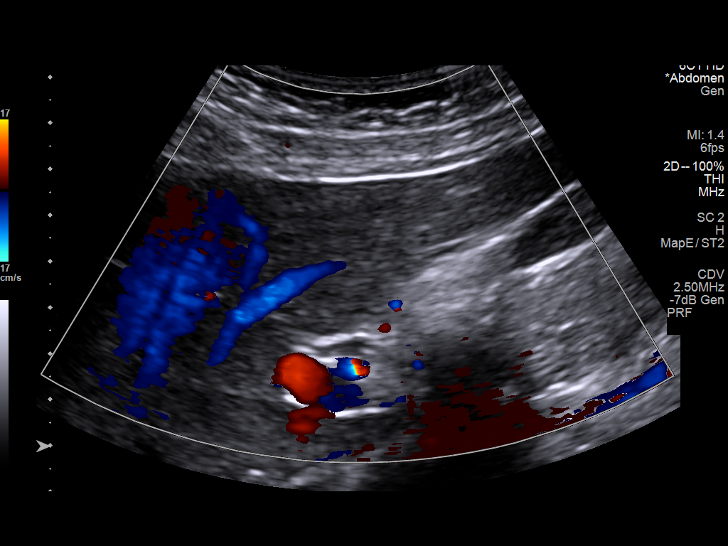
[im 30/55]
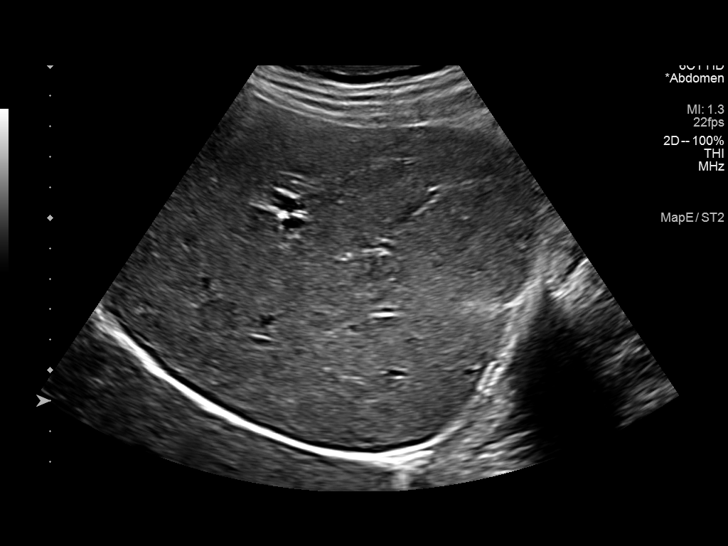
[im 34/55]
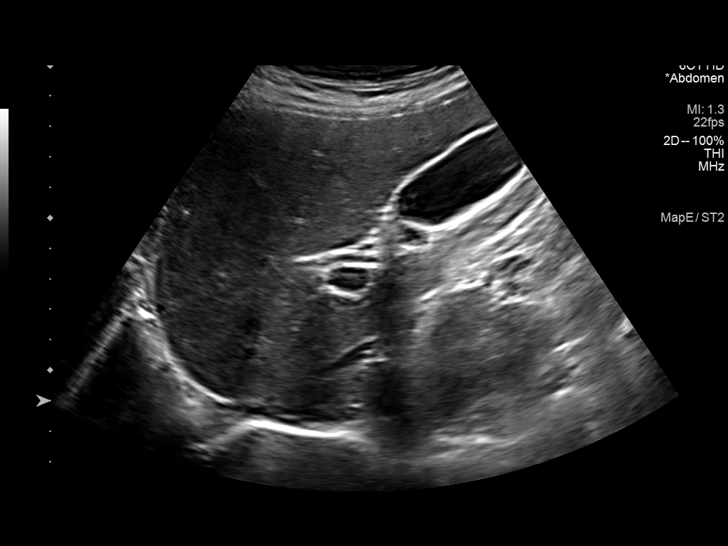
[im 37/55]
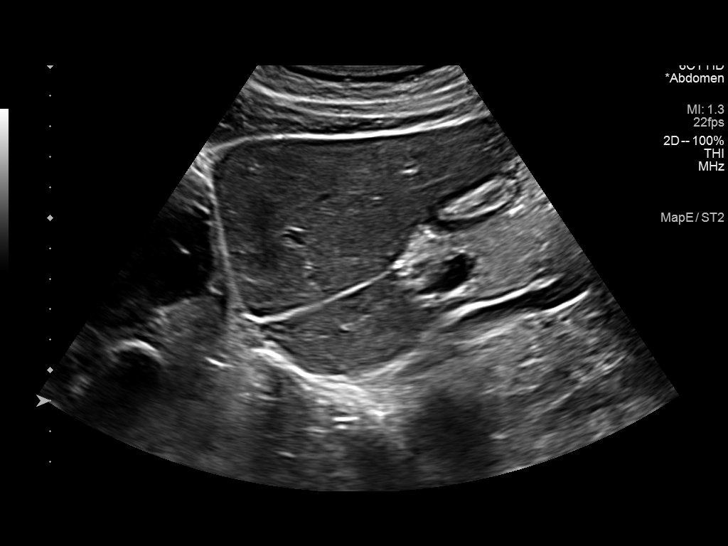
[im 41/55]
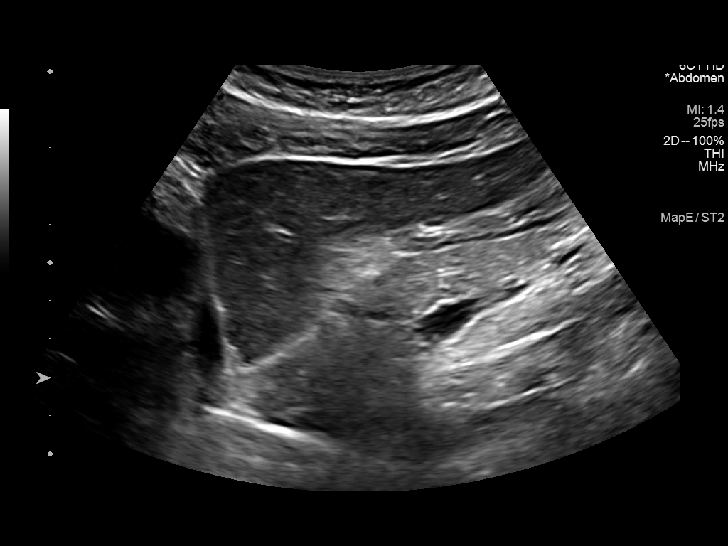
[im 46/55]
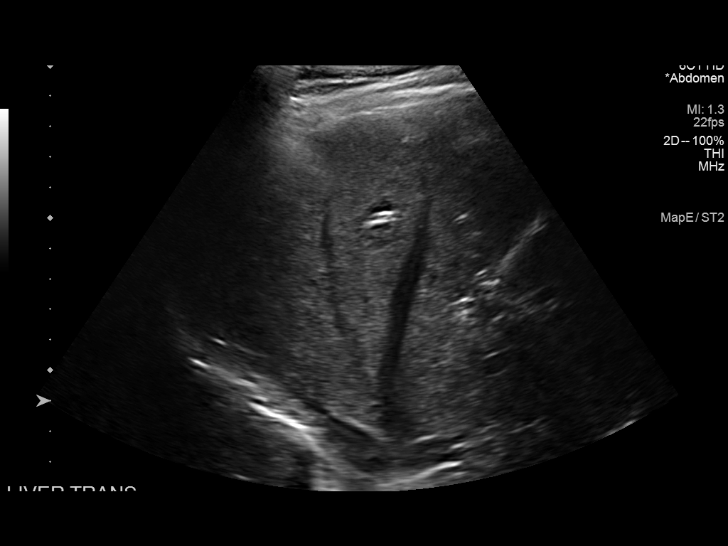
[im 50/55]
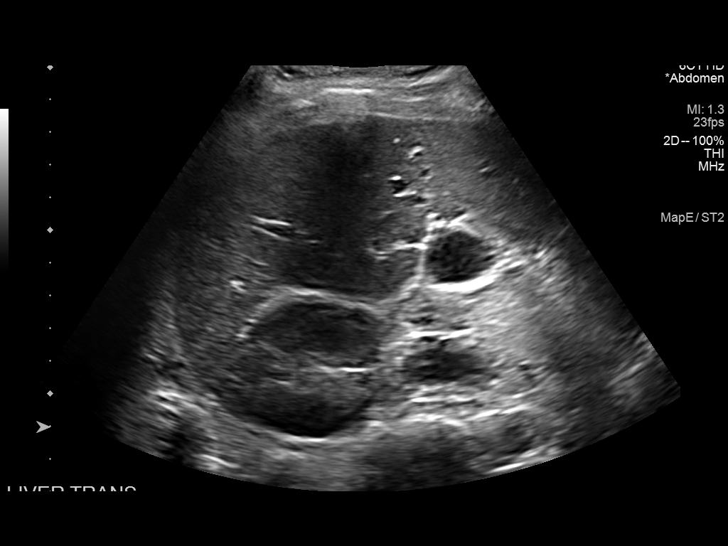
[im 55/55]
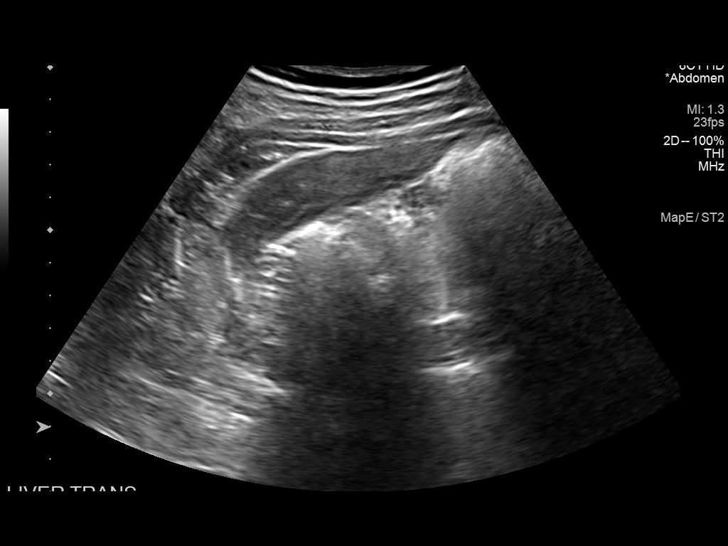

[14 of 25 positions shown; findings below may reference images not displayed]

FINDINGS: Gallbladder:

No gallstones or wall thickening visualized. No sonographic Murphy
sign noted by sonographer.

Common bile duct:

Diameter: Normal, 2 mm.

Liver:

No focal lesion identified. Within normal limits in parenchymal
echogenicity. Portal vein is patent on color Doppler imaging with
normal direction of blood flow towards the liver.

Other: None.
IMPRESSION: Normal abdominal ultrasound.  No explanation for patient's symptoms.

## 2023-11-19 ENCOUNTER — Encounter: Payer: Self-pay | Admitting: Allergy & Immunology

## 2024-01-11 DIAGNOSIS — H9202 Otalgia, left ear: Secondary | ICD-10-CM | POA: Diagnosis not present

## 2024-01-11 DIAGNOSIS — F411 Generalized anxiety disorder: Secondary | ICD-10-CM | POA: Diagnosis not present

## 2024-01-11 DIAGNOSIS — J329 Chronic sinusitis, unspecified: Secondary | ICD-10-CM | POA: Diagnosis not present

## 2024-01-28 DIAGNOSIS — Z01419 Encounter for gynecological examination (general) (routine) without abnormal findings: Secondary | ICD-10-CM | POA: Diagnosis not present

## 2024-01-28 DIAGNOSIS — Z6825 Body mass index (BMI) 25.0-25.9, adult: Secondary | ICD-10-CM | POA: Diagnosis not present

## 2024-02-15 ENCOUNTER — Ambulatory Visit: Payer: BC Managed Care – PPO | Admitting: Family Medicine

## 2024-02-18 DIAGNOSIS — N941 Unspecified dyspareunia: Secondary | ICD-10-CM | POA: Diagnosis not present

## 2024-04-14 DIAGNOSIS — N809 Endometriosis, unspecified: Secondary | ICD-10-CM | POA: Diagnosis not present

## 2024-04-14 DIAGNOSIS — R102 Pelvic and perineal pain: Secondary | ICD-10-CM | POA: Diagnosis not present

## 2024-04-16 DIAGNOSIS — M9901 Segmental and somatic dysfunction of cervical region: Secondary | ICD-10-CM | POA: Diagnosis not present

## 2024-04-16 DIAGNOSIS — M9902 Segmental and somatic dysfunction of thoracic region: Secondary | ICD-10-CM | POA: Diagnosis not present

## 2024-04-16 DIAGNOSIS — M9903 Segmental and somatic dysfunction of lumbar region: Secondary | ICD-10-CM | POA: Diagnosis not present

## 2024-04-16 DIAGNOSIS — M62830 Muscle spasm of back: Secondary | ICD-10-CM | POA: Diagnosis not present

## 2024-04-17 DIAGNOSIS — M9902 Segmental and somatic dysfunction of thoracic region: Secondary | ICD-10-CM | POA: Diagnosis not present

## 2024-04-17 DIAGNOSIS — M9903 Segmental and somatic dysfunction of lumbar region: Secondary | ICD-10-CM | POA: Diagnosis not present

## 2024-04-17 DIAGNOSIS — M62830 Muscle spasm of back: Secondary | ICD-10-CM | POA: Diagnosis not present

## 2024-04-17 DIAGNOSIS — M9901 Segmental and somatic dysfunction of cervical region: Secondary | ICD-10-CM | POA: Diagnosis not present

## 2024-04-21 DIAGNOSIS — M9902 Segmental and somatic dysfunction of thoracic region: Secondary | ICD-10-CM | POA: Diagnosis not present

## 2024-04-21 DIAGNOSIS — M9901 Segmental and somatic dysfunction of cervical region: Secondary | ICD-10-CM | POA: Diagnosis not present

## 2024-04-21 DIAGNOSIS — M9903 Segmental and somatic dysfunction of lumbar region: Secondary | ICD-10-CM | POA: Diagnosis not present

## 2024-04-21 DIAGNOSIS — M62830 Muscle spasm of back: Secondary | ICD-10-CM | POA: Diagnosis not present

## 2024-04-23 DIAGNOSIS — M9902 Segmental and somatic dysfunction of thoracic region: Secondary | ICD-10-CM | POA: Diagnosis not present

## 2024-04-23 DIAGNOSIS — M9901 Segmental and somatic dysfunction of cervical region: Secondary | ICD-10-CM | POA: Diagnosis not present

## 2024-04-23 DIAGNOSIS — M9903 Segmental and somatic dysfunction of lumbar region: Secondary | ICD-10-CM | POA: Diagnosis not present

## 2024-04-23 DIAGNOSIS — M62830 Muscle spasm of back: Secondary | ICD-10-CM | POA: Diagnosis not present

## 2024-04-24 DIAGNOSIS — M9901 Segmental and somatic dysfunction of cervical region: Secondary | ICD-10-CM | POA: Diagnosis not present

## 2024-04-24 DIAGNOSIS — M62830 Muscle spasm of back: Secondary | ICD-10-CM | POA: Diagnosis not present

## 2024-04-24 DIAGNOSIS — M9902 Segmental and somatic dysfunction of thoracic region: Secondary | ICD-10-CM | POA: Diagnosis not present

## 2024-04-24 DIAGNOSIS — M9903 Segmental and somatic dysfunction of lumbar region: Secondary | ICD-10-CM | POA: Diagnosis not present

## 2024-04-28 DIAGNOSIS — M62830 Muscle spasm of back: Secondary | ICD-10-CM | POA: Diagnosis not present

## 2024-04-28 DIAGNOSIS — M9902 Segmental and somatic dysfunction of thoracic region: Secondary | ICD-10-CM | POA: Diagnosis not present

## 2024-04-28 DIAGNOSIS — M9903 Segmental and somatic dysfunction of lumbar region: Secondary | ICD-10-CM | POA: Diagnosis not present

## 2024-04-28 DIAGNOSIS — M9901 Segmental and somatic dysfunction of cervical region: Secondary | ICD-10-CM | POA: Diagnosis not present

## 2024-05-01 DIAGNOSIS — M9903 Segmental and somatic dysfunction of lumbar region: Secondary | ICD-10-CM | POA: Diagnosis not present

## 2024-05-01 DIAGNOSIS — M9901 Segmental and somatic dysfunction of cervical region: Secondary | ICD-10-CM | POA: Diagnosis not present

## 2024-05-01 DIAGNOSIS — M62830 Muscle spasm of back: Secondary | ICD-10-CM | POA: Diagnosis not present

## 2024-05-01 DIAGNOSIS — M9902 Segmental and somatic dysfunction of thoracic region: Secondary | ICD-10-CM | POA: Diagnosis not present

## 2024-05-07 DIAGNOSIS — M9902 Segmental and somatic dysfunction of thoracic region: Secondary | ICD-10-CM | POA: Diagnosis not present

## 2024-05-07 DIAGNOSIS — M62830 Muscle spasm of back: Secondary | ICD-10-CM | POA: Diagnosis not present

## 2024-05-07 DIAGNOSIS — M9901 Segmental and somatic dysfunction of cervical region: Secondary | ICD-10-CM | POA: Diagnosis not present

## 2024-05-07 DIAGNOSIS — M9903 Segmental and somatic dysfunction of lumbar region: Secondary | ICD-10-CM | POA: Diagnosis not present

## 2024-05-12 ENCOUNTER — Telehealth (HOSPITAL_COMMUNITY): Payer: Self-pay

## 2024-05-12 DIAGNOSIS — M62830 Muscle spasm of back: Secondary | ICD-10-CM | POA: Diagnosis not present

## 2024-05-12 DIAGNOSIS — M9901 Segmental and somatic dysfunction of cervical region: Secondary | ICD-10-CM | POA: Diagnosis not present

## 2024-05-12 DIAGNOSIS — M9902 Segmental and somatic dysfunction of thoracic region: Secondary | ICD-10-CM | POA: Diagnosis not present

## 2024-05-12 DIAGNOSIS — M9903 Segmental and somatic dysfunction of lumbar region: Secondary | ICD-10-CM | POA: Diagnosis not present

## 2024-05-12 NOTE — Telephone Encounter (Signed)
 front office called pt to schedule her CPX test ordered by Dr. Ozell America, pt stated she was busy at work and will call us  back to get scheduled once she gets home. pt states she is also going out of town soon.

## 2024-05-19 DIAGNOSIS — E559 Vitamin D deficiency, unspecified: Secondary | ICD-10-CM | POA: Diagnosis not present

## 2024-05-19 DIAGNOSIS — Z131 Encounter for screening for diabetes mellitus: Secondary | ICD-10-CM | POA: Diagnosis not present

## 2024-05-19 DIAGNOSIS — Z0000 Encounter for general adult medical examination without abnormal findings: Secondary | ICD-10-CM | POA: Diagnosis not present

## 2024-05-19 DIAGNOSIS — F331 Major depressive disorder, recurrent, moderate: Secondary | ICD-10-CM | POA: Diagnosis not present

## 2024-05-28 DIAGNOSIS — M62830 Muscle spasm of back: Secondary | ICD-10-CM | POA: Diagnosis not present

## 2024-05-28 DIAGNOSIS — M9901 Segmental and somatic dysfunction of cervical region: Secondary | ICD-10-CM | POA: Diagnosis not present

## 2024-05-28 DIAGNOSIS — M9903 Segmental and somatic dysfunction of lumbar region: Secondary | ICD-10-CM | POA: Diagnosis not present

## 2024-05-28 DIAGNOSIS — M9902 Segmental and somatic dysfunction of thoracic region: Secondary | ICD-10-CM | POA: Diagnosis not present

## 2024-06-04 DIAGNOSIS — M62830 Muscle spasm of back: Secondary | ICD-10-CM | POA: Diagnosis not present

## 2024-06-04 DIAGNOSIS — M9903 Segmental and somatic dysfunction of lumbar region: Secondary | ICD-10-CM | POA: Diagnosis not present

## 2024-06-04 DIAGNOSIS — M9902 Segmental and somatic dysfunction of thoracic region: Secondary | ICD-10-CM | POA: Diagnosis not present

## 2024-06-04 DIAGNOSIS — M9901 Segmental and somatic dysfunction of cervical region: Secondary | ICD-10-CM | POA: Diagnosis not present

## 2024-06-09 DIAGNOSIS — Z79899 Other long term (current) drug therapy: Secondary | ICD-10-CM | POA: Diagnosis not present

## 2024-06-09 DIAGNOSIS — R300 Dysuria: Secondary | ICD-10-CM | POA: Diagnosis not present

## 2024-06-09 DIAGNOSIS — N809 Endometriosis, unspecified: Secondary | ICD-10-CM | POA: Diagnosis not present

## 2024-06-11 DIAGNOSIS — M9903 Segmental and somatic dysfunction of lumbar region: Secondary | ICD-10-CM | POA: Diagnosis not present

## 2024-06-11 DIAGNOSIS — M9901 Segmental and somatic dysfunction of cervical region: Secondary | ICD-10-CM | POA: Diagnosis not present

## 2024-06-11 DIAGNOSIS — M9902 Segmental and somatic dysfunction of thoracic region: Secondary | ICD-10-CM | POA: Diagnosis not present

## 2024-06-11 DIAGNOSIS — M62830 Muscle spasm of back: Secondary | ICD-10-CM | POA: Diagnosis not present

## 2024-06-18 DIAGNOSIS — M9903 Segmental and somatic dysfunction of lumbar region: Secondary | ICD-10-CM | POA: Diagnosis not present

## 2024-06-18 DIAGNOSIS — M9901 Segmental and somatic dysfunction of cervical region: Secondary | ICD-10-CM | POA: Diagnosis not present

## 2024-06-18 DIAGNOSIS — M9902 Segmental and somatic dysfunction of thoracic region: Secondary | ICD-10-CM | POA: Diagnosis not present

## 2024-06-18 DIAGNOSIS — M62830 Muscle spasm of back: Secondary | ICD-10-CM | POA: Diagnosis not present

## 2024-06-25 DIAGNOSIS — M9901 Segmental and somatic dysfunction of cervical region: Secondary | ICD-10-CM | POA: Diagnosis not present

## 2024-06-25 DIAGNOSIS — M9903 Segmental and somatic dysfunction of lumbar region: Secondary | ICD-10-CM | POA: Diagnosis not present

## 2024-06-25 DIAGNOSIS — M9902 Segmental and somatic dysfunction of thoracic region: Secondary | ICD-10-CM | POA: Diagnosis not present

## 2024-06-25 DIAGNOSIS — M62830 Muscle spasm of back: Secondary | ICD-10-CM | POA: Diagnosis not present

## 2024-07-07 DIAGNOSIS — M62830 Muscle spasm of back: Secondary | ICD-10-CM | POA: Diagnosis not present

## 2024-07-07 DIAGNOSIS — M9903 Segmental and somatic dysfunction of lumbar region: Secondary | ICD-10-CM | POA: Diagnosis not present

## 2024-07-07 DIAGNOSIS — M9902 Segmental and somatic dysfunction of thoracic region: Secondary | ICD-10-CM | POA: Diagnosis not present

## 2024-07-07 DIAGNOSIS — M9901 Segmental and somatic dysfunction of cervical region: Secondary | ICD-10-CM | POA: Diagnosis not present

## 2024-07-09 DIAGNOSIS — M9903 Segmental and somatic dysfunction of lumbar region: Secondary | ICD-10-CM | POA: Diagnosis not present

## 2024-07-09 DIAGNOSIS — M62830 Muscle spasm of back: Secondary | ICD-10-CM | POA: Diagnosis not present

## 2024-07-09 DIAGNOSIS — M9901 Segmental and somatic dysfunction of cervical region: Secondary | ICD-10-CM | POA: Diagnosis not present

## 2024-07-09 DIAGNOSIS — M9902 Segmental and somatic dysfunction of thoracic region: Secondary | ICD-10-CM | POA: Diagnosis not present

## 2024-07-16 DIAGNOSIS — M9901 Segmental and somatic dysfunction of cervical region: Secondary | ICD-10-CM | POA: Diagnosis not present

## 2024-07-16 DIAGNOSIS — M9903 Segmental and somatic dysfunction of lumbar region: Secondary | ICD-10-CM | POA: Diagnosis not present

## 2024-07-16 DIAGNOSIS — M62830 Muscle spasm of back: Secondary | ICD-10-CM | POA: Diagnosis not present

## 2024-07-16 DIAGNOSIS — M9902 Segmental and somatic dysfunction of thoracic region: Secondary | ICD-10-CM | POA: Diagnosis not present

## 2024-07-23 DIAGNOSIS — M9903 Segmental and somatic dysfunction of lumbar region: Secondary | ICD-10-CM | POA: Diagnosis not present

## 2024-07-23 DIAGNOSIS — M62830 Muscle spasm of back: Secondary | ICD-10-CM | POA: Diagnosis not present

## 2024-07-23 DIAGNOSIS — M9901 Segmental and somatic dysfunction of cervical region: Secondary | ICD-10-CM | POA: Diagnosis not present

## 2024-07-23 DIAGNOSIS — M9902 Segmental and somatic dysfunction of thoracic region: Secondary | ICD-10-CM | POA: Diagnosis not present

## 2024-07-30 DIAGNOSIS — M9903 Segmental and somatic dysfunction of lumbar region: Secondary | ICD-10-CM | POA: Diagnosis not present

## 2024-07-30 DIAGNOSIS — M9902 Segmental and somatic dysfunction of thoracic region: Secondary | ICD-10-CM | POA: Diagnosis not present

## 2024-07-30 DIAGNOSIS — M62830 Muscle spasm of back: Secondary | ICD-10-CM | POA: Diagnosis not present

## 2024-07-30 DIAGNOSIS — M9901 Segmental and somatic dysfunction of cervical region: Secondary | ICD-10-CM | POA: Diagnosis not present

## 2024-08-18 DIAGNOSIS — M9903 Segmental and somatic dysfunction of lumbar region: Secondary | ICD-10-CM | POA: Diagnosis not present

## 2024-08-18 DIAGNOSIS — M62830 Muscle spasm of back: Secondary | ICD-10-CM | POA: Diagnosis not present

## 2024-08-18 DIAGNOSIS — M9902 Segmental and somatic dysfunction of thoracic region: Secondary | ICD-10-CM | POA: Diagnosis not present

## 2024-08-18 DIAGNOSIS — M9901 Segmental and somatic dysfunction of cervical region: Secondary | ICD-10-CM | POA: Diagnosis not present

## 2024-08-21 ENCOUNTER — Telehealth: Payer: Self-pay | Admitting: Internal Medicine

## 2024-08-21 NOTE — Telephone Encounter (Signed)
 Patient was seen 1/3/5 by Dr. Darlean and was ordered to have a CPX--unable to schedule at that time due to staffing issues--LVM for patient to call to discuss scheduling

## 2024-08-25 NOTE — Telephone Encounter (Signed)
 LVM for patient to call and discuss scheduling the CPX ordered by Dr. Darlean on 11/16/23

## 2024-08-28 ENCOUNTER — Encounter: Payer: Self-pay | Admitting: Internal Medicine

## 2024-08-28 NOTE — Telephone Encounter (Signed)
 Left voice mail for patient to call and discuss scheduling the CPX ordered by Dr. Darlean on 11/16/23--we now have the capability to schedule---will mail letter requesting she contact the office
